# Patient Record
Sex: Female | Born: 1960 | Race: Black or African American | Hispanic: No | Marital: Single | Smoking: Never smoker
Health system: Southern US, Community
[De-identification: ages and names within clinical notes are randomized; demographics above are authoritative.]

## PROBLEM LIST (undated history)

## (undated) DIAGNOSIS — C50919 Malignant neoplasm of unspecified site of unspecified female breast: Secondary | ICD-10-CM

## (undated) HISTORY — PX: BIOPSY BREAST: PRO8

## (undated) HISTORY — PX: OTHER SURGICAL HISTORY: SHX169

## (undated) HISTORY — DX: Malignant neoplasm of unspecified site of unspecified female breast: C50.919

## (undated) HISTORY — PX: BREAST LUMPECTOMY: SHX2

---

## 1989-05-06 HISTORY — PX: TUBAL LIGATION: SHX77

## 1999-10-02 ENCOUNTER — Ambulatory Visit (HOSPITAL_COMMUNITY): Admission: RE | Admit: 1999-10-02 | Discharge: 1999-10-02 | Payer: Self-pay | Admitting: Family Medicine

## 1999-10-02 ENCOUNTER — Encounter: Payer: Self-pay | Admitting: Family Medicine

## 2003-04-06 ENCOUNTER — Ambulatory Visit (HOSPITAL_COMMUNITY): Admission: RE | Admit: 2003-04-06 | Discharge: 2003-04-06 | Payer: Self-pay | Admitting: Family Medicine

## 2003-12-14 ENCOUNTER — Ambulatory Visit (HOSPITAL_COMMUNITY): Admission: RE | Admit: 2003-12-14 | Discharge: 2003-12-14 | Payer: Self-pay | Admitting: Emergency Medicine

## 2003-12-22 ENCOUNTER — Ambulatory Visit (HOSPITAL_COMMUNITY): Admission: RE | Admit: 2003-12-22 | Discharge: 2003-12-22 | Payer: Self-pay | Admitting: Family Medicine

## 2005-06-13 ENCOUNTER — Encounter: Admission: RE | Admit: 2005-06-13 | Discharge: 2005-06-13 | Payer: Self-pay | Admitting: Family Medicine

## 2005-07-03 ENCOUNTER — Encounter: Admission: RE | Admit: 2005-07-03 | Discharge: 2005-07-03 | Payer: Self-pay | Admitting: Family Medicine

## 2006-07-09 ENCOUNTER — Encounter: Admission: RE | Admit: 2006-07-09 | Discharge: 2006-07-09 | Payer: Self-pay | Admitting: Family Medicine

## 2006-07-23 ENCOUNTER — Encounter: Admission: RE | Admit: 2006-07-23 | Discharge: 2006-07-23 | Payer: Self-pay | Admitting: Family Medicine

## 2006-10-14 IMAGING — CT CT CHEST W/O CM
2 of 3 series · 15 of 35 positions shown, 19 images · IV contrast (agent unspecified)
Comparison: 12/22/03.

CLINICAL DATA: Follow up 1 cm benign breast biopsy.
 CHEST CT WITHOUT CONTRAST:
TECHNIQUE: Multidetector CT imaging of the chest was performed following the standard protocol without IV contrast.

[Series 2: chest w/o · axial · non-contrast · 0.66mm/px · z∈[-346,-41]mm · 13 of 73 slices shown, 17 images]
[im 6/73  mediastinal]
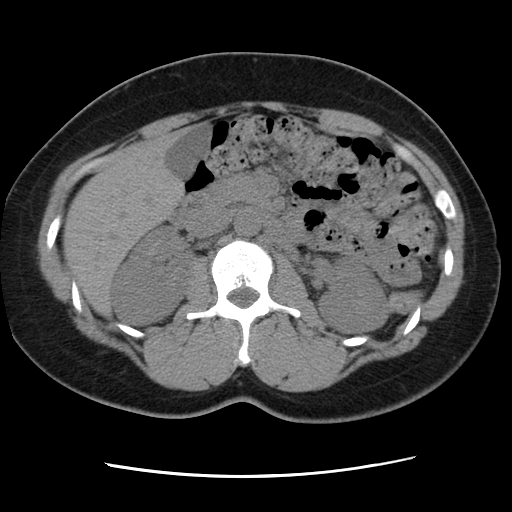
[im 6/73  lung]
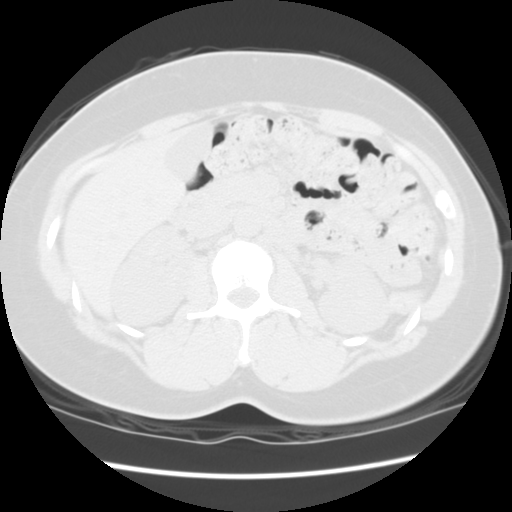
[im 11/73  lung]
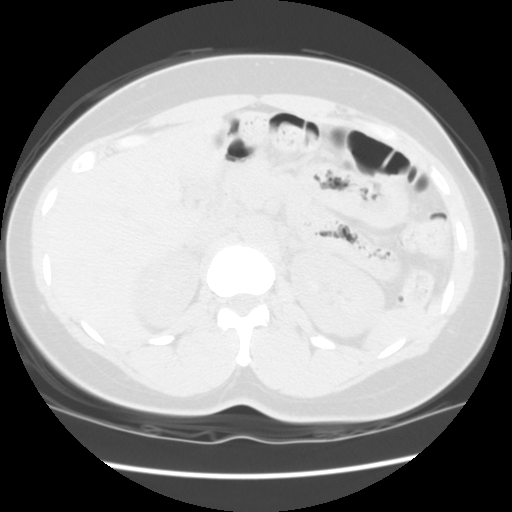
[im 17/73  lung]
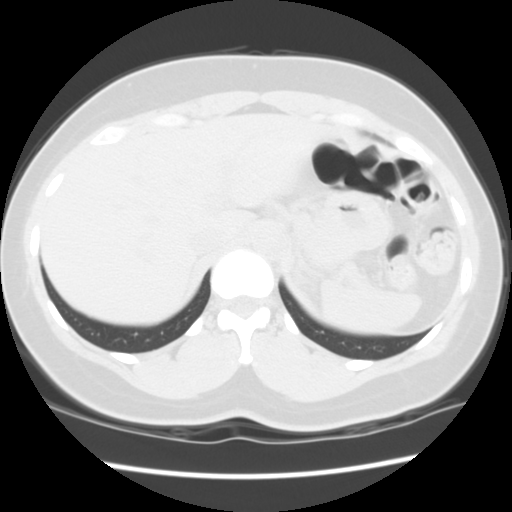
[im 22/73  lung]
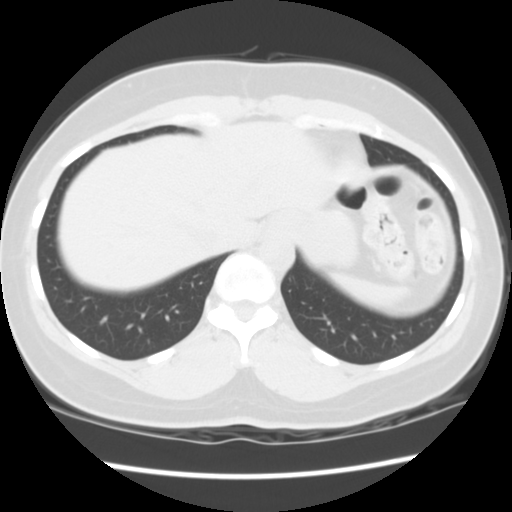
[im 27/73  mediastinal]
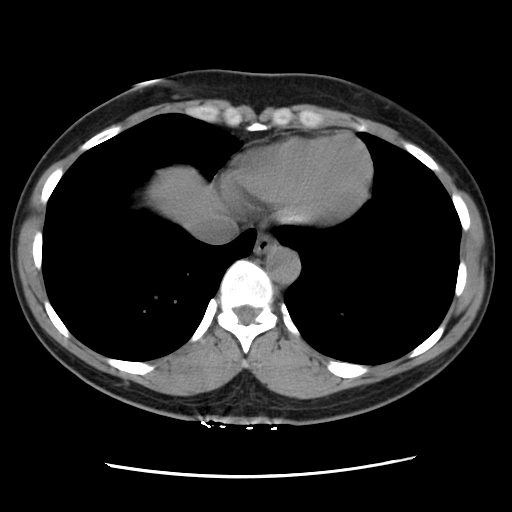
[im 27/73  lung]
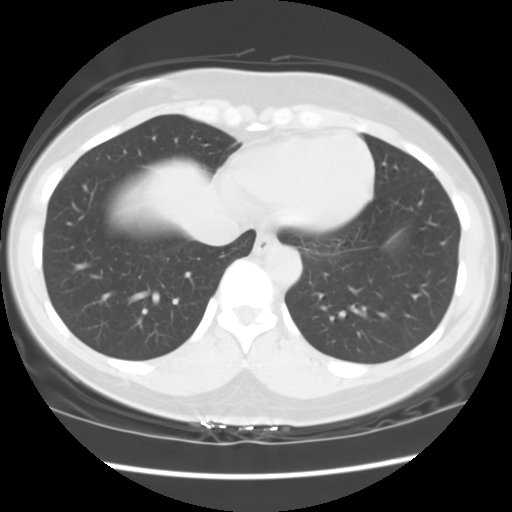
[im 33/73  lung]
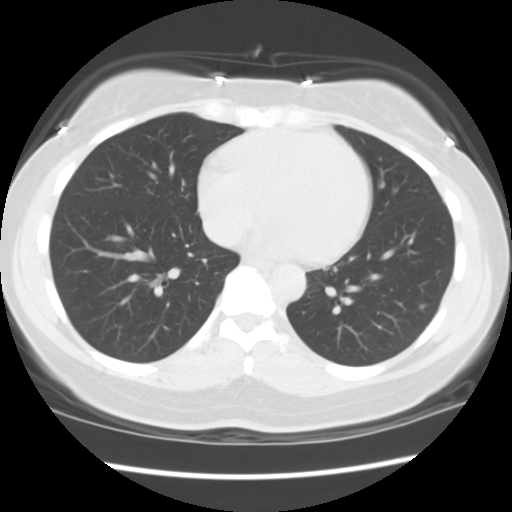
[im 36/73  lung]
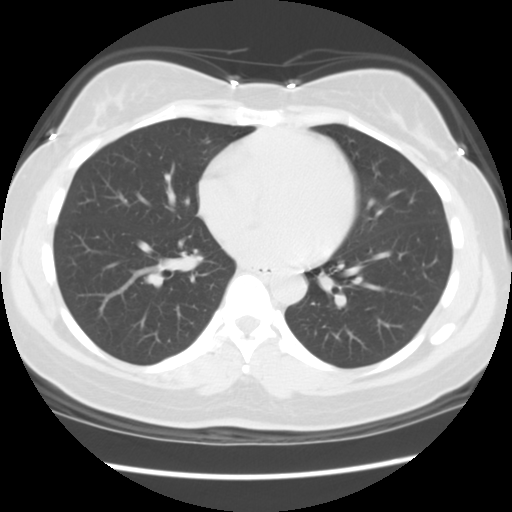
[im 41/73  lung]
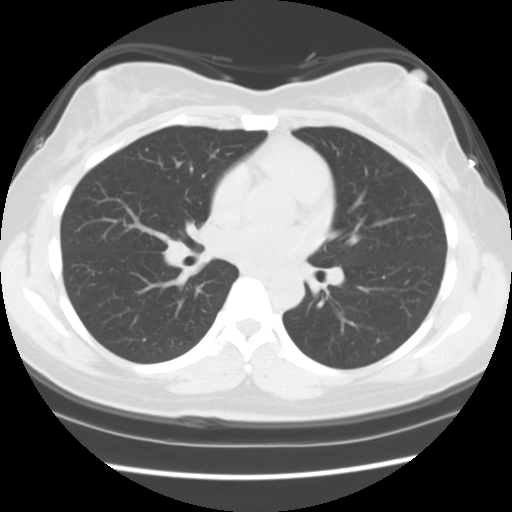
[im 46/73  mediastinal]
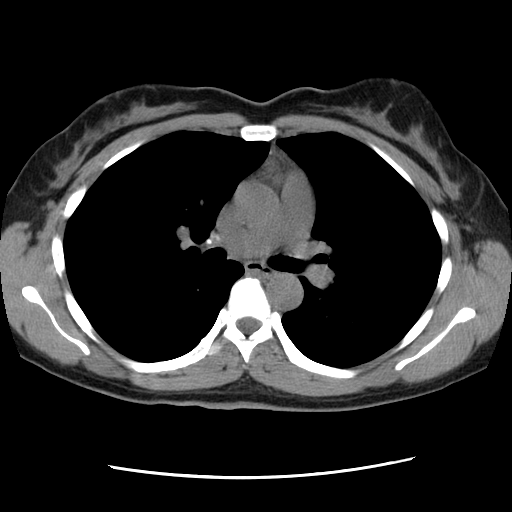
[im 46/73  lung]
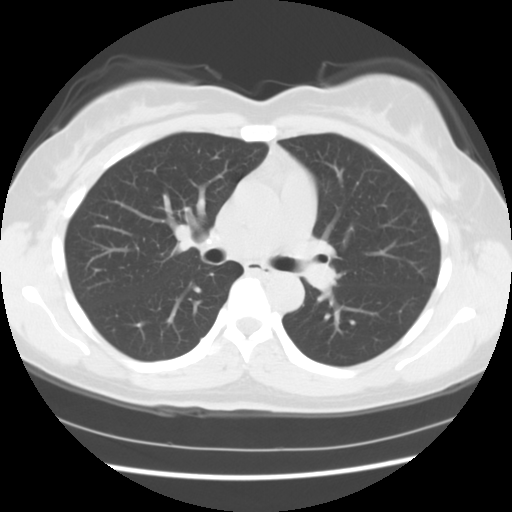
[im 51/73  lung]
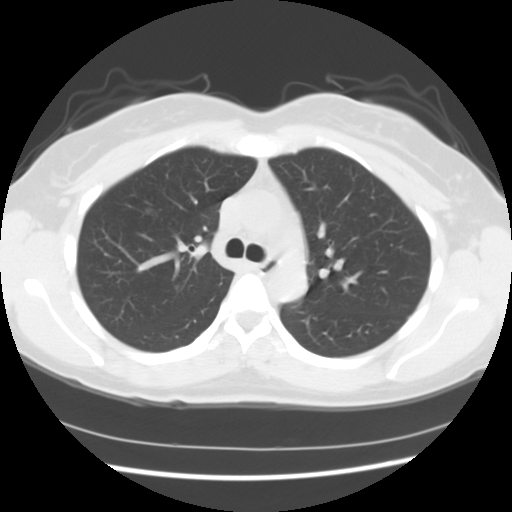
[im 57/73  lung]
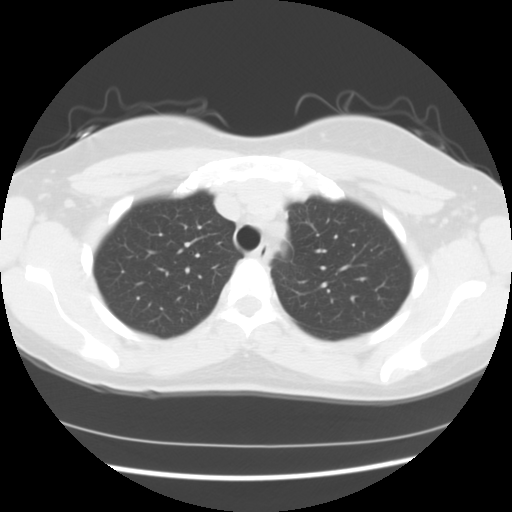
[im 62/73  lung]
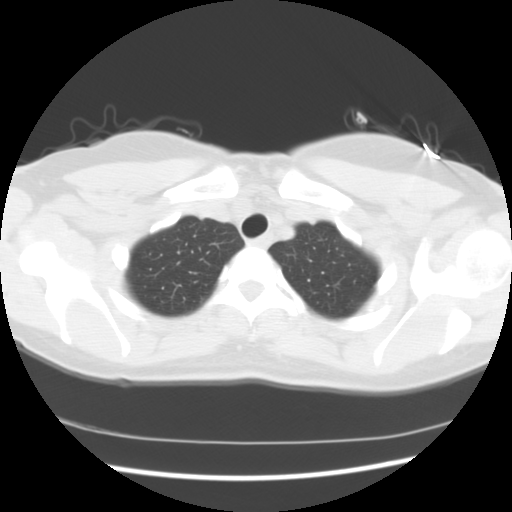
[im 67/73  mediastinal]
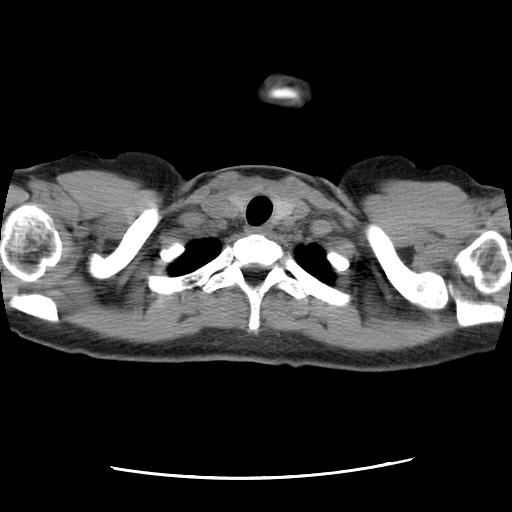
[im 67/73  lung]
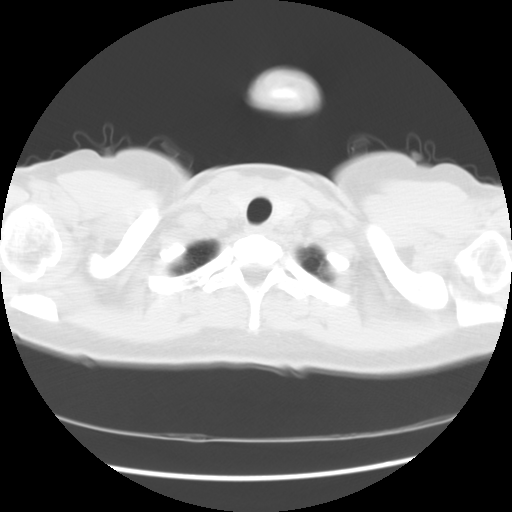

[Series 217: reformatted · sagittal · 0.71mm/px · 2 of 114 slices shown]
[im 38/114  lung]
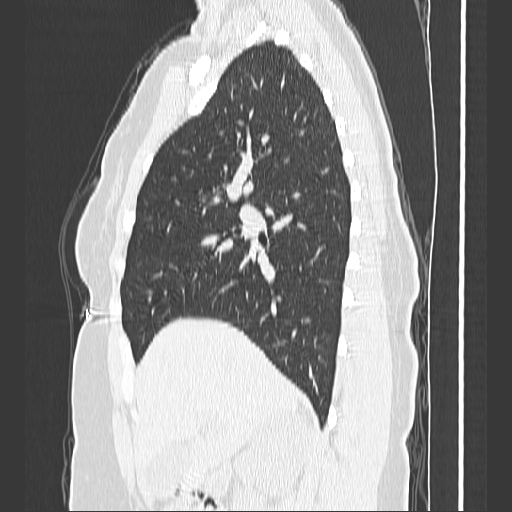
[im 76/114  lung]
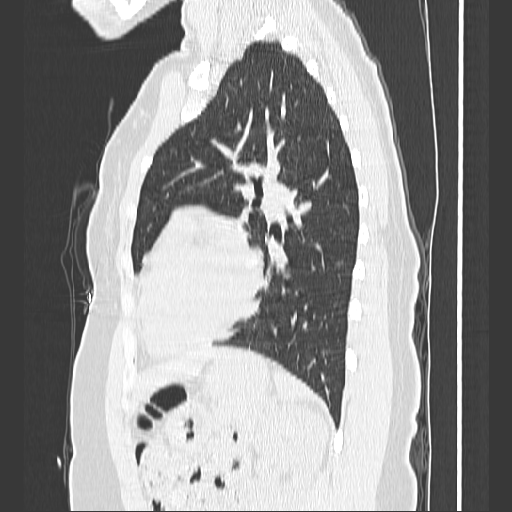

[15 of 35 positions shown; findings below may reference images not displayed]

FINDINGS: No pathologically enlarged lymph nodes are identified within the axilla.  
 Calcified right paratracheal, right hilar lymph nodes are again noted consistent with prior granulomatous inflammation.  There is no pericardial effusion.  No pleural effusion.  Calcified granuloma at the right upper lobe (image 21) stable when compared to the prior exam.  
 Previously described right upper lobe nodule is again seen measuring 9.3 x 6.3 mm (image 24).  Ground glass nodule in the left lower lobe (image 40) is also unchanged from the prior exam.  No new suspicious pulmonary nodules or masses are identified.  Review of the bone windows is unremarkable.
IMPRESSION: 1.  Stable 1 cm nodule within right upper lobe.  A follow up scan in 12 months is recommended.

## 2007-07-14 ENCOUNTER — Encounter: Admission: RE | Admit: 2007-07-14 | Discharge: 2007-07-14 | Payer: Self-pay | Admitting: Family Medicine

## 2007-08-19 ENCOUNTER — Other Ambulatory Visit: Admission: RE | Admit: 2007-08-19 | Discharge: 2007-08-19 | Payer: Self-pay | Admitting: Family Medicine

## 2009-05-06 DIAGNOSIS — C50919 Malignant neoplasm of unspecified site of unspecified female breast: Secondary | ICD-10-CM

## 2009-05-06 HISTORY — DX: Malignant neoplasm of unspecified site of unspecified female breast: C50.919

## 2010-05-06 HISTORY — PX: PORTACATH PLACEMENT: SHX2246

## 2010-05-27 ENCOUNTER — Encounter: Payer: Self-pay | Admitting: Family Medicine

## 2012-06-12 ENCOUNTER — Other Ambulatory Visit: Payer: Self-pay | Admitting: Physician Assistant

## 2012-06-12 DIAGNOSIS — R87810 Cervical high risk human papillomavirus (HPV) DNA test positive: Secondary | ICD-10-CM | POA: Insufficient documentation

## 2012-06-12 DIAGNOSIS — Z01419 Encounter for gynecological examination (general) (routine) without abnormal findings: Secondary | ICD-10-CM | POA: Insufficient documentation

## 2012-06-12 DIAGNOSIS — Z1151 Encounter for screening for human papillomavirus (HPV): Secondary | ICD-10-CM | POA: Insufficient documentation

## 2012-06-17 ENCOUNTER — Other Ambulatory Visit (HOSPITAL_COMMUNITY)
Admission: RE | Admit: 2012-06-17 | Discharge: 2012-06-17 | Disposition: A | Discharge status: Home / Self Care | Payer: Commercial Indemnity | Source: Ambulatory Visit | Attending: Family Medicine | Admitting: Family Medicine

## 2012-06-25 ENCOUNTER — Telehealth: Payer: Self-pay | Admitting: *Deleted

## 2012-06-25 NOTE — Telephone Encounter (Signed)
Confirmed 07/09/12 appt w/ pt.  Mailed letter & packet to pt. Took paperwork to med rec for chart.

## 2012-07-03 ENCOUNTER — Other Ambulatory Visit: Payer: Self-pay | Admitting: *Deleted

## 2012-07-03 DIAGNOSIS — C50919 Malignant neoplasm of unspecified site of unspecified female breast: Secondary | ICD-10-CM

## 2012-07-09 ENCOUNTER — Telehealth: Payer: Self-pay | Admitting: Oncology

## 2012-07-09 ENCOUNTER — Ambulatory Visit: Payer: Commercial Indemnity

## 2012-07-09 ENCOUNTER — Ambulatory Visit (HOSPITAL_BASED_OUTPATIENT_CLINIC_OR_DEPARTMENT_OTHER): Payer: Commercial Indemnity | Admitting: Oncology

## 2012-07-09 ENCOUNTER — Encounter: Payer: Self-pay | Admitting: Oncology

## 2012-07-09 ENCOUNTER — Other Ambulatory Visit (HOSPITAL_BASED_OUTPATIENT_CLINIC_OR_DEPARTMENT_OTHER): Payer: Commercial Indemnity

## 2012-07-09 VITALS — BP 143/83 | HR 68 | Temp 98.2°F | Resp 20 | Ht 68.5 in | Wt 192.2 lb

## 2012-07-09 DIAGNOSIS — C50911 Malignant neoplasm of unspecified site of right female breast: Secondary | ICD-10-CM

## 2012-07-09 DIAGNOSIS — Z853 Personal history of malignant neoplasm of breast: Secondary | ICD-10-CM

## 2012-07-09 LAB — COMPREHENSIVE METABOLIC PANEL (CC13)
AST: 23 U/L (ref 5–34)
Albumin: 3.7 g/dL (ref 3.5–5.0)
BUN: 13.1 mg/dL (ref 7.0–26.0)
CO2: 27 mEq/L (ref 22–29)
Calcium: 9.5 mg/dL (ref 8.4–10.4)
Creatinine: 0.8 mg/dL (ref 0.6–1.1)
Sodium: 139 mEq/L (ref 136–145)
Total Protein: 7.6 g/dL (ref 6.4–8.3)

## 2012-07-09 LAB — CBC WITH DIFFERENTIAL/PLATELET
EOS%: 4.7 % (ref 0.0–7.0)
Eosinophils Absolute: 0.2 10*3/uL (ref 0.0–0.5)
HCT: 38.3 % (ref 34.8–46.6)
HGB: 12.9 g/dL (ref 11.6–15.9)
MCH: 30.1 pg (ref 25.1–34.0)
MCHC: 33.7 g/dL (ref 31.5–36.0)
MONO#: 0.3 10*3/uL (ref 0.1–0.9)
RDW: 13.8 % (ref 11.2–14.5)
lymph#: 1.7 10*3/uL (ref 0.9–3.3)

## 2012-07-09 NOTE — Patient Instructions (Addendum)
We discussed your pathology and treatments you have received in [place]  We discussed tamoxifen  Tamoxifen oral tablet What is this medicine? TAMOXIFEN (ta MOX i fen) blocks the effects of estrogen. It is commonly used to treat breast cancer. It is also used to decrease the chance of breast cancer coming back in women who have received treatment for the disease. It may also help prevent breast cancer in women who have a high risk of developing breast cancer. This medicine may be used for other purposes; ask your health care provider or pharmacist if you have questions. What should I tell my health care provider before I take this medicine? They need to know if you have any of these conditions: -blood clots -blood disease -cataracts or impaired eyesight -endometriosis -high calcium levels -high cholesterol -irregular menstrual cycles -liver disease -stroke -uterine fibroids -an unusual or allergic reaction to tamoxifen, other medicines, foods, dyes, or preservatives -pregnant or trying to get pregnant -breast-feeding How should I use this medicine? Take this medicine by mouth with a glass of water. Follow the directions on the prescription label. You can take it with or without food. Take your medicine at regular intervals. Do not take your medicine more often than directed. Do not stop taking except on your doctor's advice. A special MedGuide will be given to you by the pharmacist with each prescription and refill. Be sure to read this information carefully each time. Talk to your pediatrician regarding the use of this medicine in children. While this drug may be prescribed for selected conditions, precautions do apply. Overdosage: If you think you have taken too much of this medicine contact a poison control center or emergency room at once. NOTE: This medicine is only for you. Do not share this medicine with others. What if I miss a dose? If you miss a dose, take it as soon as  you can. If it is almost time for your next dose, take only that dose. Do not take double or extra doses. What may interact with this medicine? -aminoglutethimide -bromocriptine -chemotherapy drugs -female hormones, like estrogens and birth control pills -letrozole -medroxyprogesterone -phenobarbital -rifampin -warfarin This list may not describe all possible interactions. Give your health care provider a list of all the medicines, herbs, non-prescription drugs, or dietary supplements you use. Also tell them if you smoke, drink alcohol, or use illegal drugs. Some items may interact with your medicine. What should I watch for while using this medicine? Visit your doctor or health care professional for regular checks on your progress. You will need regular pelvic exams, breast exams, and mammograms. If you are taking this medicine to reduce your risk of getting breast cancer, you should know that this medicine does not prevent all types of breast cancer. If breast cancer or other problems occur, there is no guarantee that it will be found at an early stage. Do not become pregnant while taking this medicine or for 2 months after stopping this medicine. Stop taking this medicine if you get pregnant or think you are pregnant and contact your doctor. This medicine may harm your unborn baby. Women who can possibly become pregnant should use birth control methods that do not use hormones during tamoxifen treatment and for 2 months after therapy has stopped. Talk with your health care provider for birth control advice. Do not breast feed while taking this medicine. What side effects may I notice from receiving this medicine? Side effects that you should report to your doctor or  health care professional as soon as possible: -changes in vision (blurred vision) -changes in your menstrual cycle -difficulty breathing or shortness of breath -difficulty walking or talking -new breast lumps -numbness -pelvic  pain or pressure -redness, blistering, peeling or loosening of the skin, including inside the mouth -skin rash or itching (hives) -sudden chest pain -swelling of lips, face, or tongue -swelling, pain or tenderness in your calf or leg -unusual bruising or bleeding -vaginal discharge that is bloody, brown, or rust -weakness -yellowing of the whites of the eyes or skin Side effects that usually do not require medical attention (report to your doctor or health care professional if they continue or are bothersome): -fatigue -hair loss, although uncommon and is usually mild -headache -hot flashes -impotence (in men) -nausea, vomiting (mild) -vaginal discharge (white or clear) This list may not describe all possible side effects. Call your doctor for medical advice about side effects. You may report side effects to FDA at 1-800-FDA-1088. Where should I keep my medicine? Keep out of the reach of children. Store at room temperature between 20 and 25 degrees C (68 and 77 degrees F). Protect from light. Keep container tightly closed. Throw away any unused medicine after the expiration date. NOTE: This sheet is a summary. It may not cover all possible information. If you have questions about this medicine, talk to your doctor, pharmacist, or health care provider.  2012, [employer]vier/Gold Standard. (01/07/2008 12:01:56 PM)

## 2012-07-09 NOTE — Telephone Encounter (Signed)
, °

## 2012-07-15 ENCOUNTER — Other Ambulatory Visit: Payer: Self-pay | Admitting: Oncology

## 2012-07-15 DIAGNOSIS — Z803 Family history of malignant neoplasm of breast: Secondary | ICD-10-CM

## 2012-07-20 NOTE — Progress Notes (Signed)
Audrey Friedman [id number] 13-Oct-1960 51 y.o. 07/20/2012 11:15 PM  CC Dr. Carlena Sax Dr. Holley Bouche REASON FOR CONSULTATION:  52 year old female with history of stage I a breast cancer (T1 B. N0 MX) diagnosed October 2008 11.  STAGE:   T1 BN 0 (stage IA) Right breast 6.0 mm ER +2% PR +4% HER-2/neu positive  REFERRING PHYSICIAN: Dr. Carlena Sax  HISTORY OF PRESENT ILLNESS:  Audrey Friedman is a 52 y.o. female.  Without significant past medical history who at the age of 57 had a mammogram performed that showed grouped/clustered calcifications at the 12:00 anterior position of the right breast. Ultrasound was negative. Patient had a stereotactic biopsy of the right breast in late August 2000 leptin. The biopsy showed a high-grade infiltrating ductal carcinoma arising in the background of high-grade intraductal Arseneau MOPP comedo type. She subsequently had a lumpectomy and sentinel lymph node biopsy on October 9. The tumor was found to be 6 mm node-negative ER 2% HER-2/neu positive PR 4%. Patient was recommended adjuvant chemotherapy consisting of carboplatinum and Taxol and Herceptin completed April 2012. She subsequently received radiation therapy adjuvantly to the right breast. She had maintenance Herceptin through February 2013. Thereafter she was on observation only. She is now relocating to [facility].   Past Medical History: Past Medical History  Diagnosis Date  . Breast cancer 2011    right breast    Past Surgical History: Past Surgical History  Procedure Laterality Date  . Mastectomy partial / lumpectomy Right 2011    Family History: Family History  Problem Relation Age of Onset  . Cancer Mother     Social History History  Substance Use Topics  . Smoking status: Never Smoker   . Smokeless tobacco: Never Used  . Alcohol Use: No    Allergies: Not on File  Current Medications: Current Outpatient Prescriptions  Medication  Sig Dispense Refill  . Cholecalciferol (VITAMIN D PO) Take by mouth once a week.       No current facility-administered medications for this visit.    OB/GYN History:menarche at age 17 patient is premenopausal she's had to live births first live birth at 46.  Fertility Discussion:not applicable Prior History of Cancer:not applicable  Health Maintenance:  Colonoscopyno Bone Densityno Last PAP smear2013  ECOG PERFORMANCE STATUS: 0 - Asymptomatic  Genetic Counseling/testing:we did discuss genetic counseling and testing apparently she has not had this done. She will think about it and let me know  REVIEW OF SYSTEMS:  A comprehensive review of systems was negative.  PHYSICAL EXAMINATION: Blood pressure 143/83, pulse 68, temperature 98.2 F (36.8 C), temperature source Oral, resp. rate 20, height 5' 8.5" (1.74 m), weight 192 lb 3.2 oz (87.181 kg).  WUJ:WJXBJ, healthy, no distress, well nourished and well developed SKIN: skin color, texture, turgor are normal HEAD: Normocephalic EYES: PERRLA, EOMI EARS: External ears normal OROPHARYNX:no exudate and no erythema  NECK: supple, no adenopathy LYMPH:  no palpable lymphadenopathy, no hepatosplenomegaly BREAST:left breast normal without mass, skin or nipple changes or axillary nodes, surgical scars noted in the right breast without any evidence of local recurrence no nipple discharge. LUNGS: clear to auscultation and percussion HEART: regular rate & rhythm ABDOMEN:abdomen soft, non-tender and no masses or organomegaly BACK: No CVA tenderness EXTREMITIES:no edema, no clubbing, no cyanosis  NEURO: alert & oriented x 3 with fluent speech, no focal motor/sensory deficits, gait normal, reflexes normal and symmetric     STUDIES/RESULTS: No results found.   LABS:    Chemistry  Component Value Date/Time   NA 139 07/09/2012 1457   K 3.8 07/09/2012 1457   CL 105 07/09/2012 1457   CO2 27 07/09/2012 1457   BUN 13.1 07/09/2012 1457    CREATININE 0.8 07/09/2012 1457      Component Value Date/Time   CALCIUM 9.5 07/09/2012 1457   ALKPHOS 73 07/09/2012 1457   AST 23 07/09/2012 1457   ALT 17 07/09/2012 1457   BILITOT 0.36 07/09/2012 1457      Lab Results  Component Value Date   WBC 4.2 07/09/2012   HGB 12.9 07/09/2012   HCT 38.3 07/09/2012   MCV 89.5 07/09/2012   PLT 203 07/09/2012    ASSESSMENT    52 year old female with  #1 history of invasive ductal carcinoma of the right breast diagnosed in 2011. She underwent a right lumpectomy with sentinel lymph node biopsy. Her final pathology revealed a 6 mm node-negative breast cancer with associated DCIS tumor was ER +2% PR +4% and HER-2/neu positive. Patient went on to receive adjuvant chemotherapy consisting of Taxol carboplatinum and Herceptin with maintenance Herceptin completed in February 2013. She also is received radiation therapy to the breast. Overall she tolerated all of her therapy well. She has no evidence of recurrent disease    PLAN:    #1 patient and I discussed her overall survivorship. We also discussed today the possibility of having her on tamoxifen 20 mg daily since her tumor was weakly ER and PR positive. I do think that she would benefit with use of an antiestrogen therapy for the ipsilateral as well as the contralateral breast. She is going to think about this.  #2 in the meantime I will plan on seeing her back in 6 months time or sooner if need arises.      Thank you so much for allowing me to participate in the care of Audrey Friedman. I will continue to follow up the patient with you and assist in her care.  All questions were answered. The patient knows to call the clinic with any problems, questions or concerns. We can certainly see the patient much sooner if necessary.  I spent 60 minutes counseling the patient face to face. The total time spent in the appointment was 60 minutes.  Drue Second, MD Medical/Oncology [facility] [phone]  (beeper) [phone] (Office)  07/20/2012, 11:15 PM

## 2012-07-21 ENCOUNTER — Ambulatory Visit (HOSPITAL_COMMUNITY)
Admission: RE | Admit: 2012-07-21 | Discharge: 2012-07-21 | Disposition: A | Discharge status: Home / Self Care | Payer: Commercial Indemnity | Source: Ambulatory Visit | Attending: Oncology | Admitting: Oncology

## 2012-07-21 DIAGNOSIS — Z803 Family history of malignant neoplasm of breast: Secondary | ICD-10-CM

## 2012-07-21 DIAGNOSIS — Z1231 Encounter for screening mammogram for malignant neoplasm of breast: Secondary | ICD-10-CM | POA: Insufficient documentation

## 2012-07-22 ENCOUNTER — Other Ambulatory Visit: Payer: Self-pay | Admitting: Oncology

## 2012-07-22 DIAGNOSIS — Z9889 Other specified postprocedural states: Secondary | ICD-10-CM

## 2012-07-22 DIAGNOSIS — Z853 Personal history of malignant neoplasm of breast: Secondary | ICD-10-CM

## 2012-07-24 ENCOUNTER — Encounter: Payer: Self-pay | Admitting: Oncology

## 2012-07-27 ENCOUNTER — Encounter: Payer: Self-pay | Admitting: Oncology

## 2012-08-14 ENCOUNTER — Ambulatory Visit
Admission: RE | Admit: 2012-08-14 | Discharge: 2012-08-14 | Disposition: A | Discharge status: Home / Self Care | Payer: Commercial Indemnity | Source: Ambulatory Visit | Attending: Oncology | Admitting: Oncology

## 2012-08-14 DIAGNOSIS — Z9889 Other specified postprocedural states: Secondary | ICD-10-CM

## 2012-08-14 DIAGNOSIS — Z853 Personal history of malignant neoplasm of breast: Secondary | ICD-10-CM

## 2012-08-28 ENCOUNTER — Encounter: Payer: Self-pay | Admitting: *Deleted

## 2012-08-28 NOTE — Progress Notes (Signed)
Mailed after appt letter to pt. 

## 2012-09-14 ENCOUNTER — Encounter: Payer: Self-pay | Admitting: Genetic Counselor

## 2012-09-14 ENCOUNTER — Other Ambulatory Visit: Payer: Commercial Indemnity | Admitting: Lab

## 2012-09-14 ENCOUNTER — Ambulatory Visit (HOSPITAL_BASED_OUTPATIENT_CLINIC_OR_DEPARTMENT_OTHER): Payer: Managed Care, Other (non HMO) | Admitting: Genetic Counselor

## 2012-09-14 DIAGNOSIS — IMO0002 Reserved for concepts with insufficient information to code with codable children: Secondary | ICD-10-CM

## 2012-09-14 DIAGNOSIS — C50911 Malignant neoplasm of unspecified site of right female breast: Secondary | ICD-10-CM

## 2012-09-14 DIAGNOSIS — C50919 Malignant neoplasm of unspecified site of unspecified female breast: Secondary | ICD-10-CM

## 2012-09-14 NOTE — Progress Notes (Signed)
Dr.  Drue Second requested a consultation for genetic counseling and risk assessment for Audrey Friedman, a 52 y.o. female, for discussion of her personal history of breast cancer. She presents to clinic today to discuss the possibility of a genetic predisposition to cancer, and to further clarify her risks, as well as her family members' risks for cancer.   HISTORY OF PRESENT ILLNESS: In 2011, at the age of 74, Tommi Crepeau was diagnosed with invasive cancer of the right breast. This was treated with lumpectomy, chemotherapy and radiation.  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    Past Medical History  Diagnosis Date  . Breast cancer 2011    right breast    Past Surgical History  Procedure Laterality Date  . Mastectomy partial / lumpectomy Right 2011    History  Substance Use Topics  . Smoking status: Never Smoker   . Smokeless tobacco: Never Used  . Alcohol Use: No    REPRODUCTIVE HISTORY AND PERSONAL RISK ASSESSMENT FACTORS: Menarche was at age 70.   Premenopausal Uterus Intact: Yes Ovaries Intact: yes G2P2A0 , first live birth at age 37  She has not previously undergone treatment for infertility.   Never used OCPs   She has not used HRT in the past.    FAMILY HISTORY:  We obtained a detailed, 4-generation family history.  Significant diagnoses are listed below: Family History  Problem Relation Age of Onset  . Throat cancer Mother     smoker  . Cancer Maternal Grandmother     unknown cancer  . HIV/AIDS Brother   . Aneurysm Brother   The patient is the only person in her family with breast cancer.  Her mother had throat cancer at an unknown age and was a a smoker.  The patient's grandmother may have had cancer, but the patient is unsure of what kind it was.  Patient's maternal ancestors are of Caucasian and African American descent, and paternal ancestors are of African American descent. There is no reported Ashkenazi Jewish ancestry. There is no known  consanguinity.  GENETIC COUNSELING RISK ASSESSMENT, DISCUSSION, AND SUGGESTED FOLLOW UP: We reviewed the natural history and genetic etiology of sporadic, familial and hereditary cancer syndromes.  About 5-10% of breast cancer is hereditary.  Of this, about 85% is the result of a BRCA1 or BRCA2 mutation.  We reviewed the red flags of hereditary cancer syndromes and the dominant inheritance patterns. Based on the medical criteria for [employer], the patient does not meet their requirements for genetic testing.  She would need to have been 45 or under at diagnosis, or have a close family member with breast or ovarian cancer.  The patient will contact her maternal aunts to determine if there is cancer in other family members and will get back with me if there is to reassess her family.  The patient's personal history of breast cancer is suggestive of the following possible diagnosis: sporadic breast cancer  We discussed that identification of a hereditary cancer syndrome may help her care providers tailor the patients medical management. If a mutation indicating hereditary cancer syndrome is detected in this case, the [employer] recommendations would include increased cancer surveillance and possible prophylactic surgery. If a mutation is detected, the patient will be referred back to the referring provider and to any additional appropriate care providers to discuss the relevant options.   If a mutation is not found in the patient, this will decrease the likelihood of a hereditary cancer syndrome as  the explanation for her breast cancer. Cancer surveillance options would be discussed for the patient according to the appropriate standard National Comprehensive Cancer Network and American Cancer Society guidelines, with consideration of their personal and family history risk factors. In this case, the patient will be referred back to their care providers for discussions of management.    In order to estimate her chance of having a BRCA mutation, we used statistical models (Penn II) and laboratory data that take into account her personal medical history, family history and ancestry.  Because each model is different, there can be a lot of variability in the risks they give.  Therefore, these numbers must be considered a rough range and not a precise risk of having a BRCA mutation.  This model estimates that she has approximately a 8% chance of having a mutation. Based on this assessment of her family and personal history, genetic testing is not recommended, as she does not meet medical criteria for genetic testing based on [employer] policy.  After considering the risks, benefits, and limitations, the patient will call her family and learn more about the family history of cancer.  If there are others in the family identified as having cancer she will call back and we will reassess her risk.   The patient was seen for a total of 60 minutes, greater than 50% of which was spent face-to-face counseling.  This plan is being carried out per Dr. Drue Second recommendations.  This note will also be sent to the referring provider via the electronic medical record. The patient will be supplied with a summary of this genetic counseling discussion as well as educational information on the discussed hereditary cancer syndromes following the conclusion of their visit.   Patient was discussed with Dr. Drue Second.    _______________________________________________________________________ For Office Staff:  Number of people involved in session: 1 Was an Intern/ student involved with case: no }

## 2012-10-12 ENCOUNTER — Telehealth: Payer: Self-pay | Admitting: Oncology

## 2012-10-12 NOTE — Telephone Encounter (Signed)
, °

## 2012-11-26 ENCOUNTER — Encounter (INDEPENDENT_AMBULATORY_CARE_PROVIDER_SITE_OTHER): Payer: Self-pay

## 2012-12-14 ENCOUNTER — Encounter (INDEPENDENT_AMBULATORY_CARE_PROVIDER_SITE_OTHER): Payer: Self-pay | Admitting: General Surgery

## 2012-12-14 ENCOUNTER — Ambulatory Visit (INDEPENDENT_AMBULATORY_CARE_PROVIDER_SITE_OTHER): Payer: Managed Care, Other (non HMO) | Admitting: General Surgery

## 2012-12-14 VITALS — BP 120/70 | HR 68 | Temp 98.9°F | Resp 14 | Ht 69.0 in | Wt 192.2 lb

## 2012-12-14 DIAGNOSIS — C50911 Malignant neoplasm of unspecified site of right female breast: Secondary | ICD-10-CM

## 2012-12-14 DIAGNOSIS — C50919 Malignant neoplasm of unspecified site of unspecified female breast: Secondary | ICD-10-CM

## 2012-12-14 NOTE — Assessment & Plan Note (Signed)
Pt has no clinical evidence of disease.  She is over 1 year out from receiving chemotherapy.  We will plan port a cath removal.   I discussed risks and benefits.

## 2012-12-14 NOTE — Patient Instructions (Signed)
Main risks are bleeding and infection.  No shower for 2 days.  At least plan on taking 1-2 additional days off after surgery.  No lifting or strenuous activity for 1 week.

## 2012-12-14 NOTE — Progress Notes (Signed)
Chief Complaint  Patient presents with  . New Evaluation    PAC removal    HISTORY: Pt is referred by Dr. Khan in consultation for history of right breast cancer.  She had BCT in 2012.  She underwent chemotherapy for triple positive tumor including the 1 year of herceptin.  She is doing well overall.  She had her mammogram this year that was negative.  She has no breast complaints.  She is ready to get her port removed.  She has not noted any new breast masses, skin dimpling, or nipple retraction.    Past Medical History  Diagnosis Date  . Breast cancer 2011    right breast    Past Surgical History  Procedure Laterality Date  . Portacath placement  05/2010  . Tubal ligation  1991  . Biopsy breast  2011 and 1990's    Current Outpatient Prescriptions  Medication Sig Dispense Refill  . Cholecalciferol (VITAMIN D PO) Take by mouth once a week.      . Multiple Vitamins-Minerals (MULTI VITAMIN/MINERALS PO) Take by mouth.       No current facility-administered medications for this visit.     Not on File   Family History  Problem Relation Age of Onset  . Throat cancer Mother     smoker  . Cancer Mother     throat  . Cancer Maternal Grandmother     unknown cancer  . HIV/AIDS Brother   . Aneurysm Brother      History   Social History  . Marital Status: Divorced    Spouse Name: N/A    Number of Children: N/A  . Years of Education: N/A   Social History Main Topics  . Smoking status: Never Smoker   . Smokeless tobacco: Never Used  . Alcohol Use: No  . Drug Use: No  . Sexually Active: Yes    REVIEW OF SYSTEMS - PERTINENT POSITIVES ONLY: 12 point review of systems negative other than HPI and PMH except for sore throat and left arm pain.    EXAM: Filed Vitals:   12/14/12 1332  BP: 120/70  Pulse: 68  Temp: 98.9 F (37.2 C)  Resp: 14   Filed Weights   12/14/12 1332  Weight: 192 lb 3.2 oz (87.181 kg)    Gen:  No acute distress.  Well nourished and well  groomed.   Neurological: Alert and oriented to person, place, and time. Coordination normal.  Head: Normocephalic and atraumatic.  Eyes: Conjunctivae are normal. Pupils are equal, round, and reactive to light. No scleral icterus.  Neck: Normal range of motion. Neck supple. No tracheal deviation or thyromegaly present.  Cardiovascular: Normal rate, regular rhythm,  and intact distal pulses.  Respiratory: Effort normal.  No respiratory distress. No chest wall tenderness. Port a cath in place upper left chest.   Breast:  No masses.  Normal post op changes right breast.  Slightly smaller right breast and expected thickening along incision.  No masses or lymphadenopathy.   GI: Soft. Bowel sounds are normal. The abdomen is soft and nontender.  There is no rebound and no guarding.  Musculoskeletal: Normal range of motion. Extremities are nontender.  Lymphadenopathy: No cervical, preauricular, postauricular or axillary adenopathy is present Skin: Skin is warm and dry. No rash noted. No diaphoresis. No erythema. No pallor. No clubbing, cyanosis, or edema.   Psychiatric: Normal mood and affect. Behavior is normal. Judgment and thought content normal.    LABORATORY RESULTS: Available labs are reviewed    Labs from 6 months ago normal.    RADIOLOGY RESULTS: See E-Chart or I-Site for most recent results.  Images and reports are reviewed. Mammogram IMPRESSION:  Post lumpectomy changes/scarring within the upper outer right  breast without findings suspicious for malignancy.  BI-RADS CATEGORY 2: Benign finding(s).    ASSESSMENT AND PLAN: Breast cancer Pt has no clinical evidence of disease.  She is over 1 year out from receiving chemotherapy.  We will plan port a cath removal.   I discussed risks and benefits.     Janice Seales L Analiyah Lechuga MD Surgical Oncology, General and Endocrine Surgery [facility]      Visit Diagnoses: Breast cancer  Primary Care Physician: Candace  Smith, MD  Oncologist:  Dr. Khan.     

## 2012-12-24 ENCOUNTER — Encounter (HOSPITAL_BASED_OUTPATIENT_CLINIC_OR_DEPARTMENT_OTHER): Payer: Self-pay | Admitting: *Deleted

## 2012-12-24 NOTE — Progress Notes (Signed)
Bring all medications

## 2012-12-31 ENCOUNTER — Encounter (HOSPITAL_BASED_OUTPATIENT_CLINIC_OR_DEPARTMENT_OTHER): Payer: Self-pay | Admitting: *Deleted

## 2012-12-31 ENCOUNTER — Ambulatory Visit (HOSPITAL_BASED_OUTPATIENT_CLINIC_OR_DEPARTMENT_OTHER): Payer: Managed Care, Other (non HMO) | Admitting: Anesthesiology

## 2012-12-31 ENCOUNTER — Ambulatory Visit (HOSPITAL_BASED_OUTPATIENT_CLINIC_OR_DEPARTMENT_OTHER)
Admission: RE | Admit: 2012-12-31 | Discharge: 2012-12-31 | Disposition: A | Discharge status: Home / Self Care | Payer: Managed Care, Other (non HMO) | Source: Ambulatory Visit | Attending: General Surgery | Admitting: General Surgery

## 2012-12-31 ENCOUNTER — Encounter (HOSPITAL_BASED_OUTPATIENT_CLINIC_OR_DEPARTMENT_OTHER)
Admission: RE | Disposition: A | Discharge status: Home / Self Care | Payer: Self-pay | Source: Ambulatory Visit | Attending: General Surgery

## 2012-12-31 ENCOUNTER — Encounter (HOSPITAL_BASED_OUTPATIENT_CLINIC_OR_DEPARTMENT_OTHER): Payer: Self-pay | Admitting: Anesthesiology

## 2012-12-31 DIAGNOSIS — Z452 Encounter for adjustment and management of vascular access device: Secondary | ICD-10-CM

## 2012-12-31 DIAGNOSIS — Z9221 Personal history of antineoplastic chemotherapy: Secondary | ICD-10-CM | POA: Insufficient documentation

## 2012-12-31 DIAGNOSIS — Z853 Personal history of malignant neoplasm of breast: Secondary | ICD-10-CM | POA: Insufficient documentation

## 2012-12-31 HISTORY — PX: PORT-A-CATH REMOVAL: SHX5289

## 2012-12-31 SURGERY — REMOVAL PORT-A-CATH
Anesthesia: General | Laterality: Left | Wound class: Clean

## 2012-12-31 MED ORDER — CHLORHEXIDINE GLUCONATE 4 % EX LIQD: 1.0000 "application " | Freq: Once | CUTANEOUS | Status: DC

## 2012-12-31 MED ORDER — SODIUM CHLORIDE 0.9 % IV SOLN: 250.0000 mL | INTRAVENOUS | Status: DC | PRN

## 2012-12-31 MED ORDER — HYDROCODONE-ACETAMINOPHEN 5-325 MG PO TABS: 1.0000 | ORAL_TABLET | ORAL | Status: DC | PRN

## 2012-12-31 MED ORDER — ONDANSETRON HCL 4 MG/2ML IJ SOLN
INTRAMUSCULAR | Status: DC | PRN
  Administered 2012-12-31: 4 mg via INTRAVENOUS

## 2012-12-31 MED ORDER — SODIUM CHLORIDE 0.9 % IJ SOLN: 3.0000 mL | Freq: Two times a day (BID) | INTRAMUSCULAR | Status: DC

## 2012-12-31 MED ORDER — OXYCODONE HCL 5 MG PO TABS
5.0000 mg | ORAL_TABLET | Freq: Once | ORAL | Status: AC | PRN
  Administered 2012-12-31: 5 mg via ORAL

## 2012-12-31 MED ORDER — ONDANSETRON HCL 4 MG/2ML IJ SOLN: 4.0000 mg | Freq: Four times a day (QID) | INTRAMUSCULAR | Status: DC | PRN

## 2012-12-31 MED ORDER — PROPOFOL 10 MG/ML IV BOLUS
INTRAVENOUS | Status: DC | PRN
  Administered 2012-12-31: 200 mg via INTRAVENOUS

## 2012-12-31 MED ORDER — BUPIVACAINE-EPINEPHRINE 0.5% -1:200000 IJ SOLN
INTRAMUSCULAR | Status: DC | PRN
  Administered 2012-12-31: 6 mL

## 2012-12-31 MED ORDER — OXYCODONE HCL 5 MG PO TABS: 5.0000 mg | ORAL_TABLET | ORAL | Status: DC | PRN

## 2012-12-31 MED ORDER — LACTATED RINGERS IV SOLN
INTRAVENOUS | Status: DC
  Administered 2012-12-31: 08:00:00 via INTRAVENOUS

## 2012-12-31 MED ORDER — LIDOCAINE HCL (CARDIAC) 20 MG/ML IV SOLN
INTRAVENOUS | Status: DC | PRN
  Administered 2012-12-31: 50 mg via INTRAVENOUS

## 2012-12-31 MED ORDER — OXYCODONE HCL 5 MG/5ML PO SOLN: 5.0000 mg | Freq: Once | ORAL | Status: AC | PRN

## 2012-12-31 MED ORDER — CEFAZOLIN SODIUM-DEXTROSE 2-3 GM-% IV SOLR
2.0000 g | INTRAVENOUS | Status: AC
Start: 2012-12-31 — End: 2012-12-31
  Administered 2012-12-31: 2 g via INTRAVENOUS

## 2012-12-31 MED ORDER — FENTANYL CITRATE 0.05 MG/ML IJ SOLN: 50.0000 ug | INTRAMUSCULAR | Status: DC | PRN

## 2012-12-31 MED ORDER — SODIUM CHLORIDE 0.9 % IJ SOLN: 3.0000 mL | INTRAMUSCULAR | Status: DC | PRN

## 2012-12-31 MED ORDER — ACETAMINOPHEN 325 MG PO TABS: 650.0000 mg | ORAL_TABLET | ORAL | Status: DC | PRN

## 2012-12-31 MED ORDER — MIDAZOLAM HCL 5 MG/5ML IJ SOLN
INTRAMUSCULAR | Status: DC | PRN
  Administered 2012-12-31: 1 mg via INTRAVENOUS

## 2012-12-31 MED ORDER — FENTANYL CITRATE 0.05 MG/ML IJ SOLN
INTRAMUSCULAR | Status: DC | PRN
  Administered 2012-12-31: 50 ug via INTRAVENOUS

## 2012-12-31 MED ORDER — MIDAZOLAM HCL 2 MG/2ML IJ SOLN: 1.0000 mg | INTRAMUSCULAR | Status: DC | PRN

## 2012-12-31 MED ORDER — DEXAMETHASONE SODIUM PHOSPHATE 4 MG/ML IJ SOLN
INTRAMUSCULAR | Status: DC | PRN
  Administered 2012-12-31: 8 mg via INTRAVENOUS

## 2012-12-31 MED ORDER — HYDROMORPHONE HCL PF 1 MG/ML IJ SOLN: 0.2500 mg | INTRAMUSCULAR | Status: DC | PRN

## 2012-12-31 MED ORDER — ACETAMINOPHEN 650 MG RE SUPP: 650.0000 mg | RECTAL | Status: DC | PRN

## 2012-12-31 SURGICAL SUPPLY — 39 items
ADH SKN CLS APL DERMABOND .7 (GAUZE/BANDAGES/DRESSINGS) ×1
BLADE HEX COATED 2.75 (ELECTRODE) ×2 IMPLANT
BLADE SURG 15 STRL LF DISP TIS (BLADE) ×1 IMPLANT
BLADE SURG 15 STRL SS (BLADE) ×2
CANISTER SUCTION 1200CC (MISCELLANEOUS) IMPLANT
CHLORAPREP W/TINT 26ML (MISCELLANEOUS) ×2 IMPLANT
CLOTH BEACON ORANGE TIMEOUT ST (SAFETY) ×2 IMPLANT
COVER MAYO STAND STRL (DRAPES) ×2 IMPLANT
COVER TABLE BACK 60X90 (DRAPES) ×2 IMPLANT
DECANTER SPIKE VIAL GLASS SM (MISCELLANEOUS) IMPLANT
DERMABOND ADVANCED (GAUZE/BANDAGES/DRESSINGS) ×1
DERMABOND ADVANCED .7 DNX12 (GAUZE/BANDAGES/DRESSINGS) ×1 IMPLANT
DRAPE PED LAPAROTOMY (DRAPES) ×2 IMPLANT
DRAPE UTILITY XL STRL (DRAPES) ×2 IMPLANT
ELECT REM PT RETURN 9FT ADLT (ELECTROSURGICAL) ×2
ELECTRODE REM PT RTRN 9FT ADLT (ELECTROSURGICAL) ×1 IMPLANT
GLOVE BIO SURGEON STRL SZ 6 (GLOVE) ×2 IMPLANT
GLOVE BIOGEL PI IND STRL 6.5 (GLOVE) ×1 IMPLANT
GLOVE BIOGEL PI IND STRL 7.5 (GLOVE) IMPLANT
GLOVE BIOGEL PI INDICATOR 6.5 (GLOVE) ×1
GLOVE BIOGEL PI INDICATOR 7.5 (GLOVE) ×1
GLOVE SURG SS PI 7.0 STRL IVOR (GLOVE) ×2 IMPLANT
GOWN PREVENTION PLUS XLARGE (GOWN DISPOSABLE) ×2 IMPLANT
GOWN PREVENTION PLUS XXLARGE (GOWN DISPOSABLE) ×3 IMPLANT
NDL HYPO 25X1 1.5 SAFETY (NEEDLE) ×1 IMPLANT
NEEDLE HYPO 25X1 1.5 SAFETY (NEEDLE) ×2 IMPLANT
NS IRRIG 1000ML POUR BTL (IV SOLUTION) IMPLANT
PACK BASIN DAY SURGERY FS (CUSTOM PROCEDURE TRAY) ×2 IMPLANT
PENCIL BUTTON HOLSTER BLD 10FT (ELECTRODE) ×2 IMPLANT
SLEEVE SCD COMPRESS KNEE MED (MISCELLANEOUS) ×1 IMPLANT
SUT MNCRL AB 4-0 PS2 18 (SUTURE) ×2 IMPLANT
SUT VIC AB 3-0 SH 27 (SUTURE) ×2
SUT VIC AB 3-0 SH 27X BRD (SUTURE) ×1 IMPLANT
SYR CONTROL 10ML LL (SYRINGE) ×2 IMPLANT
TOWEL OR 17X24 6PK STRL BLUE (TOWEL DISPOSABLE) ×2 IMPLANT
TOWEL OR NON WOVEN STRL DISP B (DISPOSABLE) ×2 IMPLANT
TUBE CONNECTING 20X1/4 (TUBING) IMPLANT
TUBING SCD EXPRESS 7FT (MISCELLANEOUS) ×1 IMPLANT
YANKAUER SUCT BULB TIP NO VENT (SUCTIONS) IMPLANT

## 2012-12-31 NOTE — H&P (View-Only) (Signed)
Chief Complaint  Patient presents with  . New Evaluation    PAC removal    HISTORY: Pt is referred by Dr. Welton Flakes in consultation for history of right breast cancer.  She had BCT in 2012.  She underwent chemotherapy for triple positive tumor including the 1 year of herceptin.  She is doing well overall.  She had her mammogram this year that was negative.  She has no breast complaints.  She is ready to get her port removed.  She has not noted any new breast masses, skin dimpling, or nipple retraction.    Past Medical History  Diagnosis Date  . Breast cancer 2011    right breast    Past Surgical History  Procedure Laterality Date  . Portacath placement  05/2010  . Tubal ligation  1991  . Biopsy breast  2011 and 1990's    Current Outpatient Prescriptions  Medication Sig Dispense Refill  . Cholecalciferol (VITAMIN D PO) Take by mouth once a week.      . Multiple Vitamins-Minerals (MULTI VITAMIN/MINERALS PO) Take by mouth.       No current facility-administered medications for this visit.     Not on File   Family History  Problem Relation Age of Onset  . Throat cancer Mother     smoker  . Cancer Mother     throat  . Cancer Maternal Grandmother     unknown cancer  . HIV/AIDS Brother   . Aneurysm Brother      History   Social History  . Marital Status: Divorced    Spouse Name: N/A    Number of Children: N/A  . Years of Education: N/A   Social History Main Topics  . Smoking status: Never Smoker   . Smokeless tobacco: Never Used  . Alcohol Use: No  . Drug Use: No  . Sexually Active: Yes    REVIEW OF SYSTEMS - PERTINENT POSITIVES ONLY: 12 point review of systems negative other than HPI and PMH except for sore throat and left arm pain.    EXAM: Filed Vitals:   12/14/12 1332  BP: 120/70  Pulse: 68  Temp: 98.9 F (37.2 C)  Resp: 14   Filed Weights   12/14/12 1332  Weight: 192 lb 3.2 oz (87.181 kg)    Gen:  No acute distress.  Well nourished and well  groomed.   Neurological: Alert and oriented to person, place, and time. Coordination normal.  Head: Normocephalic and atraumatic.  Eyes: Conjunctivae are normal. Pupils are equal, round, and reactive to light. No scleral icterus.  Neck: Normal range of motion. Neck supple. No tracheal deviation or thyromegaly present.  Cardiovascular: Normal rate, regular rhythm,  and intact distal pulses.  Respiratory: Effort normal.  No respiratory distress. No chest wall tenderness. Port a cath in place upper left chest.   Breast:  No masses.  Normal post op changes right breast.  Slightly smaller right breast and expected thickening along incision.  No masses or lymphadenopathy.   GI: Soft. Bowel sounds are normal. The abdomen is soft and nontender.  There is no rebound and no guarding.  Musculoskeletal: Normal range of motion. Extremities are nontender.  Lymphadenopathy: No cervical, preauricular, postauricular or axillary adenopathy is present Skin: Skin is warm and dry. No rash noted. No diaphoresis. No erythema. No pallor. No clubbing, cyanosis, or edema.   Psychiatric: Normal mood and affect. Behavior is normal. Judgment and thought content normal.    LABORATORY RESULTS: Available labs are reviewed  Labs from 6 months ago normal.    RADIOLOGY RESULTS: See E-Chart or I-Site for most recent results.  Images and reports are reviewed. Mammogram IMPRESSION:  Post lumpectomy changes/scarring within the upper outer right  breast without findings suspicious for malignancy.  BI-RADS CATEGORY 2: Benign finding(s).    ASSESSMENT AND PLAN: Breast cancer Pt has no clinical evidence of disease.  She is over 1 year out from receiving chemotherapy.  We will plan port a cath removal.   I discussed risks and benefits.     Maudry Diego MD Surgical Oncology, General and Endocrine Surgery [facility]      Visit Diagnoses: Breast cancer  Primary Care Physician: Merri Brunette, MD  Oncologist:  Dr. Welton Flakes.

## 2012-12-31 NOTE — Anesthesia Preprocedure Evaluation (Signed)
Anesthesia Evaluation  Patient identified by MRN, date of birth, ID band Patient awake    Reviewed: Allergy & Precautions, H&P , NPO status , Patient's Chart, lab work & pertinent test results  Airway Mallampati: I TM Distance: >3 FB Neck ROM: Full    Dental no notable dental hx. (+) Teeth Intact and Dental Advisory Given   Pulmonary neg pulmonary ROS,  breath sounds clear to auscultation  Pulmonary exam normal       Cardiovascular negative cardio ROS  Rhythm:Regular Rate:Normal     Neuro/Psych negative neurological ROS  negative psych ROS   GI/Hepatic negative GI ROS, Neg liver ROS,   Endo/Other  negative endocrine ROS  Renal/GU negative Renal ROS  negative genitourinary   Musculoskeletal   Abdominal   Peds  Hematology negative hematology ROS (+)   Anesthesia Other Findings   Reproductive/Obstetrics negative OB ROS                           Anesthesia Physical Anesthesia Plan  ASA: II  Anesthesia Plan: General   Post-op Pain Management:    Induction: Intravenous  Airway Management Planned: LMA  Additional Equipment:   Intra-op Plan:   Post-operative Plan: Extubation in OR  Informed Consent: I have reviewed the patients History and Physical, chart, labs and discussed the procedure including the risks, benefits and alternatives for the proposed anesthesia with the patient or authorized representative who has indicated his/her understanding and acceptance.   Dental advisory given  Plan Discussed with: CRNA  Anesthesia Plan Comments:         Anesthesia Quick Evaluation

## 2012-12-31 NOTE — Op Note (Addendum)
  PRE-OPERATIVE DIAGNOSIS:  un-needed Port-A-Cath for breast cancer  POST-OPERATIVE DIAGNOSIS:  Same   PROCEDURE:  Procedure(s):  REMOVAL left subclavian PORT-A-CATH  SURGEON:  Surgeon(s):  Almond Lint, MD  ANESTHESIA:   General + local  EBL:   Minimal  SPECIMEN:  None  Complications : none known  Procedure:   Pt was  identified in the holding area and taken to the operating room where she was placed supine on the operating room table.  General anesthesia was induced via LMA.  The Left upper chest was prepped and draped.  The prior incision was anesthetized with local anesthetic.  The incision was opened with a #15 blade.  The subcutaneous tissue was divided with the cautery.  The port was identified and the capsule opened. There were no sutures to remove, as it appeared these were dissolvable.  The port was then removed and pressure held on the tract.  The catheter appeared intact without evidence of breakage.  The wound was inspected for hemostasis, which was achieved with cautery.  The wound was closed with 3-0 vicryl deep dermal interrupted sutures and 4-0 Monocryl running subcuticular suture.  The wound was cleaned, dried, and dressed with dermabond.  The patient was awakened from anesthesia and taken to the PACU in stable condition.  Needle, sponge, and instrument counts are correct.

## 2012-12-31 NOTE — Transfer of Care (Signed)
Immediate Anesthesia Transfer of Care Note  Patient: Audrey Friedman  Procedure(s) Performed: Procedure(s): REMOVAL PORT-A-CATH (Left)  Patient Location: PACU  Anesthesia Type:General  Level of Consciousness: sedated  Airway & Oxygen Therapy: Patient Spontanous Breathing and Patient connected to face mask oxygen  Post-op Assessment: Report given to PACU RN and Post -op Vital signs reviewed and stable  Post vital signs: Reviewed and stable  Complications: No apparent anesthesia complications

## 2012-12-31 NOTE — Anesthesia Procedure Notes (Signed)
Procedure Name: LMA Insertion Performed by: Ephrata Verville, [place] Pre-anesthesia Checklist: Patient identified, Emergency Drugs available, Suction available and Patient being monitored Patient Re-evaluated:Patient Re-evaluated prior to inductionOxygen Delivery Method: Circle System Utilized Preoxygenation: Pre-oxygenation with 100% oxygen Intubation Type: IV induction Ventilation: Mask ventilation without difficulty LMA: LMA inserted LMA Size: 4.0 Number of attempts: 1 Airway Equipment and Method: bite block Placement Confirmation: positive ETCO2 Tube secured with: Tape Dental Injury: Teeth and Oropharynx as per pre-operative assessment      

## 2012-12-31 NOTE — Anesthesia Postprocedure Evaluation (Signed)
  Anesthesia Post-op Note  Patient: Audrey Friedman  Procedure(s) Performed: Procedure(s): REMOVAL PORT-A-CATH (Left)  Patient Location: PACU  Anesthesia Type:General  Level of Consciousness: awake, alert  and oriented  Airway and Oxygen Therapy: Patient Spontanous Breathing  Post-op Pain: mild  Post-op Assessment: Post-op Vital signs reviewed, Patient's Cardiovascular Status Stable, Respiratory Function Stable, Patent Airway and No signs of Nausea or vomiting  Post-op Vital Signs: Reviewed and stable  Complications: No apparent anesthesia complications

## 2012-12-31 NOTE — Interval H&P Note (Signed)
History and Physical Interval Note:  12/31/2012 8:09 AM  Audrey Friedman  has presented today for surgery, with the diagnosis of breast cancer  The various methods of treatment have been discussed with the patient and family. After consideration of risks, benefits and other options for treatment, the patient has consented to  Procedure(s): REMOVAL PORT-A-CATH (N/A) as a surgical intervention .  The patient's history has been reviewed, patient examined, no change in status, stable for surgery.  I have reviewed the patient's chart and labs.  Questions were answered to the patient's satisfaction.     Leigh Blas

## 2013-01-01 ENCOUNTER — Encounter (HOSPITAL_BASED_OUTPATIENT_CLINIC_OR_DEPARTMENT_OTHER): Payer: Self-pay | Admitting: General Surgery

## 2013-01-01 ENCOUNTER — Telehealth (INDEPENDENT_AMBULATORY_CARE_PROVIDER_SITE_OTHER): Payer: Self-pay

## 2013-01-01 NOTE — Telephone Encounter (Signed)
LMOV.  Pt does not need to come in for a post op appointment unless she has questions or concerns.  We need to know what date she is asking to return to work so the letter can be sent to her employer.

## 2013-01-05 ENCOUNTER — Encounter (INDEPENDENT_AMBULATORY_CARE_PROVIDER_SITE_OTHER): Payer: Self-pay

## 2013-01-05 NOTE — Telephone Encounter (Signed)
The pt called back and I let her know that Cyndra Numbers has done her work note and it will be sent to her.  She doesn't need to see Dr Donell Beers before she goes to work but she can see her in 4 weeks.  I scheduled her to come in 9/29 at 12 pm.

## 2013-01-08 ENCOUNTER — Encounter (INDEPENDENT_AMBULATORY_CARE_PROVIDER_SITE_OTHER): Payer: Self-pay

## 2013-01-08 ENCOUNTER — Telehealth (INDEPENDENT_AMBULATORY_CARE_PROVIDER_SITE_OTHER): Payer: Self-pay

## 2013-01-08 NOTE — Telephone Encounter (Signed)
Revised RTW note faxed to patient's employer today.

## 2013-01-13 ENCOUNTER — Ambulatory Visit: Payer: Commercial Indemnity | Admitting: Oncology

## 2013-01-13 ENCOUNTER — Other Ambulatory Visit: Payer: Commercial Indemnity | Admitting: Lab

## 2013-01-18 ENCOUNTER — Other Ambulatory Visit (HOSPITAL_BASED_OUTPATIENT_CLINIC_OR_DEPARTMENT_OTHER): Payer: Managed Care, Other (non HMO) | Admitting: Lab

## 2013-01-18 ENCOUNTER — Encounter: Payer: Self-pay | Admitting: Oncology

## 2013-01-18 ENCOUNTER — Ambulatory Visit (HOSPITAL_BASED_OUTPATIENT_CLINIC_OR_DEPARTMENT_OTHER): Payer: Managed Care, Other (non HMO) | Admitting: Oncology

## 2013-01-18 VITALS — BP 128/82 | HR 99 | Temp 98.0°F | Resp 20 | Ht 69.0 in | Wt 191.9 lb

## 2013-01-18 DIAGNOSIS — C50911 Malignant neoplasm of unspecified site of right female breast: Secondary | ICD-10-CM

## 2013-01-18 DIAGNOSIS — C50919 Malignant neoplasm of unspecified site of unspecified female breast: Secondary | ICD-10-CM

## 2013-01-18 LAB — CBC WITH DIFFERENTIAL/PLATELET
Basophils Absolute: 0 10*3/uL (ref 0.0–0.1)
EOS%: 3.6 % (ref 0.0–7.0)
Eosinophils Absolute: 0.2 10*3/uL (ref 0.0–0.5)
HCT: 38.4 % (ref 34.8–46.6)
HGB: 12.7 g/dL (ref 11.6–15.9)
LYMPH%: 34.3 % (ref 14.0–49.7)
MCH: 29.4 pg (ref 25.1–34.0)
MCV: 88.7 fL (ref 79.5–101.0)
MONO%: 7.2 % (ref 0.0–14.0)
NEUT#: 2.5 10*3/uL (ref 1.5–6.5)
NEUT%: 54.3 % (ref 38.4–76.8)
Platelets: 265 10*3/uL (ref 145–400)
RDW: 14.3 % (ref 11.2–14.5)

## 2013-01-18 LAB — COMPREHENSIVE METABOLIC PANEL (CC13)
AST: 24 U/L (ref 5–34)
Albumin: 3.7 g/dL (ref 3.5–5.0)
Alkaline Phosphatase: 76 U/L (ref 40–150)
BUN: 11.9 mg/dL (ref 7.0–26.0)
Creatinine: 0.9 mg/dL (ref 0.6–1.1)
Glucose: 90 mg/dl (ref 70–140)
Potassium: 4.1 mEq/L (ref 3.5–5.1)

## 2013-01-18 NOTE — Patient Instructions (Addendum)
Return in 6 months  Breast Cancer Survivor Follow-Up Breast cancer begins when cells in the breast divide too rapidly. The extra cells form a lump (tumor). When the cancer is treated, the goal is to get rid of all cancer cells. However, sometimes a few cells survive. These cancer cells can then grow. They become recurrent cancer. This means the cancer comes back after treatment.  Most cases of recurrent breast cancer develop 3 to 5 years after treatment. However, sometimes it comes back just a few months after treatment. Other times, it does not come back until years later. If the cancer comes back in the same area as the first breast cancer, it is called a local recurrence. If the cancer comes back somewhere else in the body, it is called regional recurrence if the site is fairly near the breast or distant recurrence if it is far from the breast. Your caregiver may also use the term metastasize to indicate a cancer that has gone to another part of your body. Treatment is still possible after either kind of recurrence. The cancer can still be controlled.  CAUSES OF RECURRENT CANCER No one knows exactly why breast cancer starts in the first place. Why the cancer comes back after treatment is also not clear. It is known that certain conditions, called risk factors, can make this more likely. They include:  Developing breast cancer for the first time before age 85.  Having breast cancer that involves the lymph nodes. These are small, round pieces of tissue found all over the body. Their job is to help fight infections.  Having a large tumor. Cancer is more apt to come back if the first tumor was bigger than 2 inches (5 cm).  Having certain types of breast cancer, such as:  Inflammatory breast cancer. This rare type grows rapidly and causes the breast to become red and swollen.  A high-grade tumor. The grade of a tumor indicates how fast it will grow and spread. High-grade tumors grow more quickly than  other types.  HER2 cancer. This refers to the tumor's genetic makeup. Tumors that have this type of gene are more likely to come back after treatment.  Having close tumor margins. This refers to the space between the tumor and normal, noncancerous cells. If the space is small, the tumor has a greater chance of coming back.  Having treatment involving a surgery to remove the tumor but not the entire breast (lumpectomy) and no radiation therapy. CARE AFTER BREAST CANCER Home Monitoring Women who have had breast cancer should continue to examine their breasts every month. The goal is to catch the cancer quickly if it comes back. Many women find it helpful to do so on the same day each month and to mark the calendar as a reminder. Let your caregiver know immediately if you have any signs of recurrent breast cancer. Symptoms will vary, depending on where the cancer recurs. The original type of treatment can also make a difference. Symptoms of local recurrence after a lumpectomy or a recurrence in the opposite breast may include:  A new lump or thickening in the breast.  A change in the way the skin looks on the breast (such as a rash, dimpling, or wrinkling).  Redness or swelling of the breast.  Changes in the nipple (such as being red, puckered, swollen, or leaking fluid). Symptoms of a recurrence after a breast removal surgery (mastectomy) may include:  A lump or thickening under the skin.  A thickening  around the mastectomy scar. Symptoms of regional recurrence in the lymph nodes near the breast may include:  A lump under the arm or above the collarbone.  Swelling of the arm.  Pain in the arm, shoulder, or chest.  Numbness in the hand or arm. Symptoms of distant recurrence may include:  A cough that does not go away.  Trouble breathing or shortness of breath.  Pain in the bones or the chest. This is pain that lasts or does not respond to rest and medicine.  Headaches.  Sudden  vision problems.  Dizziness.  Nausea or vomiting.  Losing weight without trying to.  Persistent abdominal pain.  Changes in bowel movements or blood in the stool.  Yellowing of the skin or eyes (jaundice).  Blood in the urine or bloody vaginal discharge. Clinical Monitoring  It is helpful to keep a schedule of appointments for needed tests and exams. This includes physical exams, breast exams, exams of the lymph nodes, and general exams.  For the first 3 years after being treated for breast cancer, see your caregiver every 3 to 6 months.  For years 4 and 5 after breast cancer, see your caregiver every 6 to 12 months.  After 5 years, see your caregiver at least once a year.  Regular breast X-rays (mammograms) should continue even if you had a mastectomy.  A mammogram should be done 1 year after the mammogram that first detected breast cancer.  A mammogram should be done every 6 to 12 months after that. Follow your caregiver's advice.  A pelvic exam done by your caregiver checks whether female organs are the normal size and shape. The exam is usually done every year. Ask your caregiver if that schedule is right for you.  Women taking tamoxifen should report any vaginal bleeding immediately to their caregiver. Tamoxifen is often given to women with a certain type of breast cancer. It has been shown to help prevent recurrence.  You will need to decide who your primary caregiver will be.  Most people continue to see their cancer specialist (oncologist) every 3 to 6 months for the first year after cancer treatment.  At some point, you may want to go back to seeing your family caregiver. You would no longer see your oncologist for regular checkups. Many women do this about 1 year after their first diagnosis of breast cancer.  You will still need to be seen every so often by your oncologist. Ask how often that should be. Coordinate this with your family or primary caregiver.  Think  about having genetic counseling. This would provide information on traits that can be passed or inherited from one generation to the next. In some cases, breast cancer runs in families. Tell your caregiver if you:  Are of Ashkenazi Jewish heritage.  Have any family member who has had ovarian cancer.  Have a mother, sister, or daughter who had breast cancer before age 65.  Have 2 or more close relatives who have had breast cancer. This means a mother, sister, daughter, aunt, or grandmother.  Had breast cancer in both breasts.  Have a female relative who has had breast cancer.  Some tests are not recommended for routine screening. Someone recovering from breast cancer does not need to have these tests if there are no problems. The tests have risks, such as radiation exposure, and can be costly. The risks of these tests are thought to be greater than the benefits:  Blood tests.  Chest X-rays.  Bone  scans.  Liver ultrasound.  Computed tomography (CT scan).  Positron emission tomography (PET scan).  Magnetic resonance imaging (MRI scan). DIAGNOSIS OF RECURRENT CANCER Recurrent breast cancer may be suspected for various reasons. A mammogram may not look normal. You might feel a lump or have other symptoms. Your caregiver may find something unusual during an exam. To be sure, your caregiver will probably order some tests. The tests are needed because there are symptoms or hints of a problem. They could include:  Blood tests, including a test to check how well the liver is working. The liver is a common site for a distant cancer recurrence.  Imaging tests that create pictures of the inside of the body. These tests include:  Chest X-rays to show if the cancer has come back in the lungs.  CT scans to create detailed pictures of various areas of the body and help find a distant recurrence.  MRI scans to find anything unusual in the breast, chest, or lymph nodes.  Breast ultrasound tests  to examine the breasts.  Bone scans to create a picture of your whole skeleton and find cancer in bony areas.  PET scans to create an image of the whole body. PET scans can be used together with CT scans to show more detail.  Biopsy. A small sample of tissue is taken and checked under a microscope. If cancer cells are found, they may be tested to see if they contain the HER2 gene or the hormones estrogen and progesterone. This will help your caregiver decide how to treat the recurrent cancer. TREATMENT  How recurrent breast cancer is treated depends on where the new cancer is found. The type of treatment that was used for the first breast cancer makes a difference, too. A combination of treatments may be used. Options include:  Surgery.  If the cancer comes back in the breast that was not treated before, you may need a lumpectomy or mastectomy.  If the cancer comes back in the breast that was treated before, you may need a mastectomy.  The lymph nodes under the arm may need to be removed.  Radiation therapy.  For a local recurrence, radiation may be used if it was not used during the first treatment.  For a distance recurrence, radiation is sometimes used.  Chemotherapy.  This may be used before surgery to treat recurrent breast cancer.  This may be used to treat recurrent cancer that cannot be treated with surgery.  This may be used to treat a distant recurrence.  Hormone therapy.  Women with the HER2 gene may be given hormone therapy to attack this gene. Document Released: 12/19/2010 Document Revised: 07/15/2011 Document Reviewed: 12/19/2010 [facility] Patient Information 2014 [place], [place].

## 2013-01-20 ENCOUNTER — Telehealth: Payer: Self-pay | Admitting: Oncology

## 2013-01-20 NOTE — Telephone Encounter (Signed)
, °

## 2013-01-24 NOTE — Progress Notes (Signed)
OFFICE PROGRESS NOTE  CC** Dr. Carlena Sax  Dr. Holley Bouche Dr. Everardo Beals  DIAGNOSIS:52 year old female with history of stage I a breast cancer (T1 B. N0 MX) diagnosed October 2011.   STAGE:  T1 BN 0 (stage IA)  Right breast  6.0 mm ER +2% PR +4% HER-2/neu positive    PRIOR THERAPY: #1patient who at the age of 89 had a mammogram performed that showed grouped/clustered calcifications at the 12:00 anterior position of the right breast. Ultrasound was negative. Patient had a stereotactic biopsy of the right breast in late August 2000 leptin. The biopsy showed a high-grade infiltrating ductal carcinoma arising in the background of high-grade intraductal Arseneau MOPP comedo type. She subsequently had a lumpectomy and sentinel lymph node biopsy on October 9. The tumor was found to be 6 mm node-negative ER 2% HER-2/neu positive PR 4%. Patient was recommended adjuvant chemotherapy consisting of carboplatinum and Taxol and Herceptin completed April 2012. She subsequently received radiation therapy adjuvantly to the right breast. She had maintenance Herceptin through February 2013  CURRENT THERAPY:observation  INTERVAL HISTORY: Martin Smeal 52 y.o. female returns for followup visit today. Overall she seems to be doing well. She has no nausea or vomiting no fevers chills or night sweats.her major of the template review of systems is negative.  MEDICAL HISTORY: Past Medical History  Diagnosis Date  . Breast cancer 2011    right breast    ALLERGIES:  has No Known Allergies.  MEDICATIONS:  Current Outpatient Prescriptions  Medication Sig Dispense Refill  . Cholecalciferol (VITAMIN D PO) Take by mouth once a week.      Marland Kitchen ibuprofen (ADVIL,MOTRIN) 200 MG tablet Take 800 mg by mouth every 6 (six) hours as needed for pain.       . Multiple Vitamins-Minerals (MULTI VITAMIN/MINERALS PO) Take by mouth.       No current facility-administered medications for this visit.     SURGICAL HISTORY:  Past Surgical History  Procedure Laterality Date  . Portacath placement  05/2010  . Tubal ligation  1991  . Biopsy breast  2011 and 1990's  . Right breast lumpectomy      2011  . Port-a-cath removal Left 12/31/2012    Procedure: REMOVAL PORT-A-CATH;  Surgeon: Almond Lint, MD;  Location: [facility];  Service: General;  Laterality: Left;    REVIEW OF SYSTEMS:  Pertinent items are noted in HPI.   HEALTH MAINTENANCE:  PHYSICAL EXAMINATION: Blood pressure 128/82, pulse 99, temperature 98 F (36.7 C), temperature source Oral, resp. rate 20, height 5\' 9"  (1.753 m), weight 191 lb 14.4 oz (87.045 kg), last menstrual period 12/17/2012. Body mass index is 28.33 kg/(m^2). ECOG PERFORMANCE STATUS: 0 - Asymptomatic   General appearance: alert, cooperative and appears stated age Neck: no adenopathy, no carotid bruit, no JVD, supple, symmetrical, trachea midline and thyroid not enlarged, symmetric, no tenderness/mass/nodules Lymph nodes: Cervical, supraclavicular, and axillary nodes normal. Resp: clear to auscultation bilaterally Back: symmetric, no curvature. ROM normal. No CVA tenderness. Cardio: regular rate and rhythm, S1, S2 normal, no murmur, click, rub or gallop GI: soft, non-tender; bowel sounds normal; no masses,  no organomegaly Extremities: extremities normal, atraumatic, no cyanosis or edema Neurologic: Grossly normal   LABORATORY DATA: Lab Results  Component Value Date   WBC 4.5 01/18/2013   HGB 12.7 01/18/2013   HCT 38.4 01/18/2013   MCV 88.7 01/18/2013   PLT 265 01/18/2013      Chemistry      Component Value Date/Time  NA 140 01/18/2013 1350   K 4.1 01/18/2013 1350   CL 105 07/09/2012 1457   CO2 28 01/18/2013 1350   BUN 11.9 01/18/2013 1350   CREATININE 0.9 01/18/2013 1350      Component Value Date/Time   CALCIUM 9.6 01/18/2013 1350   ALKPHOS 76 01/18/2013 1350   AST 24 01/18/2013 1350   ALT 16 01/18/2013 1350   BILITOT 0.27 01/18/2013  1350       RADIOGRAPHIC STUDIES:  No results found.  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 She underwent a right lumpectomy with sentinel lymph node biopsy. Her final pathology revealed a 6 mm node-negative breast cancer with associated DCIS tumor was ER +2% PR +4% and HER-2/neu positive. Patient went on to receive adjuvant chemotherapy consisting of Taxol carboplatinum and Herceptin with maintenance Herceptin completed in February 2013. She also is received radiation therapy to the breast. Overall she tolerated all of her therapy well. She has no evidence of recurrent disease  #2 patient has no evidence of recurrent disease   PLAN:   #1 patient will continue to be observed.  #2 I will see her back in 6 months time.  #3 she will update her on mammograms   All questions were answered. The patient knows to call the clinic with any problems, questions or concerns. We can certainly see the patient much sooner if necessary.  I spent 20 minutes counseling the patient face to face. The total time spent in the appointment was 25 minutes.    Drue Second, MD Medical/Oncology [facility] [phone] (beeper) [phone] (Office)

## 2013-02-01 ENCOUNTER — Ambulatory Visit (INDEPENDENT_AMBULATORY_CARE_PROVIDER_SITE_OTHER): Payer: Managed Care, Other (non HMO) | Admitting: General Surgery

## 2013-02-01 ENCOUNTER — Encounter (INDEPENDENT_AMBULATORY_CARE_PROVIDER_SITE_OTHER): Payer: Self-pay | Admitting: General Surgery

## 2013-02-01 VITALS — BP 126/70 | HR 72 | Temp 98.7°F | Resp 14 | Ht 69.0 in | Wt 193.2 lb

## 2013-02-01 DIAGNOSIS — C50919 Malignant neoplasm of unspecified site of unspecified female breast: Secondary | ICD-10-CM

## 2013-02-01 NOTE — Patient Instructions (Signed)
Try stretching, massage, heating pad for left arm.  Can also use BenGay or other such cream to help with discomfort.  If this does not resolve/significantly improve in 1-2 months, please give [place] a call.

## 2013-02-01 NOTE — Progress Notes (Signed)
HISTORY: Patient is four-week status post removal of left subclavian Port-A-Cath. She had had her breast cancer treatment in [place].  She is doing reasonably well overall except for some pain in her left upper arm.  She is not taking any Advil or Tylenol for this discomfort. She took very few pain pills overall.    EXAM: General:  Alert and oriented. Incision:  No significant hematoma.  No infection.     PATHOLOGY: n/a   ASSESSMENT AND PLAN:   Breast cancer No evidence of surgical complications.  Advised pt to use heat and massage/stretching for muscle pain in arm  Follow up as needed.        Maudry Diego, MD Surgical Oncology, General & Endocrine Surgery [facility]  No primary provider on file. No ref. provider found

## 2013-02-01 NOTE — Assessment & Plan Note (Signed)
No evidence of surgical complications.  Advised pt to use heat and massage/stretching for muscle pain in arm  Follow up as needed.

## 2013-03-11 ENCOUNTER — Other Ambulatory Visit: Payer: Self-pay

## 2013-07-23 ENCOUNTER — Other Ambulatory Visit: Payer: Managed Care, Other (non HMO)

## 2013-07-23 ENCOUNTER — Ambulatory Visit: Payer: Managed Care, Other (non HMO) | Admitting: Adult Health

## 2013-07-23 ENCOUNTER — Other Ambulatory Visit: Payer: Self-pay | Admitting: Oncology

## 2013-07-23 DIAGNOSIS — Z9889 Other specified postprocedural states: Secondary | ICD-10-CM

## 2013-07-23 DIAGNOSIS — Z853 Personal history of malignant neoplasm of breast: Secondary | ICD-10-CM

## 2013-08-16 ENCOUNTER — Ambulatory Visit
Admission: RE | Admit: 2013-08-16 | Discharge: 2013-08-16 | Disposition: A | Discharge status: Home / Self Care | Payer: 59 | Source: Ambulatory Visit | Attending: Oncology | Admitting: Oncology

## 2013-08-16 DIAGNOSIS — Z853 Personal history of malignant neoplasm of breast: Secondary | ICD-10-CM

## 2013-08-16 DIAGNOSIS — Z9889 Other specified postprocedural states: Secondary | ICD-10-CM

## 2013-08-24 ENCOUNTER — Other Ambulatory Visit: Payer: Self-pay | Admitting: Nurse Practitioner

## 2013-08-24 ENCOUNTER — Other Ambulatory Visit (HOSPITAL_COMMUNITY)
Admission: RE | Admit: 2013-08-24 | Discharge: 2013-08-24 | Disposition: A | Discharge status: Home / Self Care | Payer: 59 | Source: Ambulatory Visit | Attending: Nurse Practitioner | Admitting: Nurse Practitioner

## 2013-08-24 DIAGNOSIS — Z124 Encounter for screening for malignant neoplasm of cervix: Secondary | ICD-10-CM | POA: Insufficient documentation

## 2013-08-24 DIAGNOSIS — R87810 Cervical high risk human papillomavirus (HPV) DNA test positive: Secondary | ICD-10-CM | POA: Insufficient documentation

## 2013-08-24 DIAGNOSIS — Z1151 Encounter for screening for human papillomavirus (HPV): Secondary | ICD-10-CM | POA: Insufficient documentation

## 2013-09-13 ENCOUNTER — Other Ambulatory Visit: Payer: Self-pay | Admitting: Obstetrics and Gynecology

## 2014-02-18 ENCOUNTER — Other Ambulatory Visit: Payer: Self-pay

## 2014-07-18 ENCOUNTER — Other Ambulatory Visit: Payer: Self-pay | Admitting: Family Medicine

## 2014-07-18 DIAGNOSIS — Z853 Personal history of malignant neoplasm of breast: Secondary | ICD-10-CM

## 2014-07-18 DIAGNOSIS — Z9889 Other specified postprocedural states: Secondary | ICD-10-CM

## 2014-08-05 ENCOUNTER — Telehealth: Payer: Self-pay | Admitting: *Deleted

## 2014-08-05 NOTE — Telephone Encounter (Signed)
Received referral from [facility] office for f/u w/ Dr. Jana Hakim since Dr. Humphrey Rolls is no longer here.  Called pt and confirmed 08/31/14 appt w/ her.  Placed a note in EPIC to make sure pt fills out a packet at time of check in.  Manson Allan at referring office to make them aware.  Placed a copy of the records in Dr. Virgie Dad box and took one to HIM to scan.

## 2014-08-22 ENCOUNTER — Ambulatory Visit
Admission: RE | Admit: 2014-08-22 | Discharge: 2014-08-22 | Disposition: A | Discharge status: Home / Self Care | Payer: 59 | Source: Ambulatory Visit | Attending: Family Medicine | Admitting: Family Medicine

## 2014-08-22 DIAGNOSIS — Z853 Personal history of malignant neoplasm of breast: Secondary | ICD-10-CM

## 2014-08-22 DIAGNOSIS — Z9889 Other specified postprocedural states: Secondary | ICD-10-CM

## 2014-08-30 ENCOUNTER — Other Ambulatory Visit: Payer: Self-pay | Admitting: *Deleted

## 2014-08-30 DIAGNOSIS — C50919 Malignant neoplasm of unspecified site of unspecified female breast: Secondary | ICD-10-CM

## 2014-08-31 ENCOUNTER — Ambulatory Visit (HOSPITAL_BASED_OUTPATIENT_CLINIC_OR_DEPARTMENT_OTHER): Payer: 59 | Admitting: Oncology

## 2014-08-31 ENCOUNTER — Other Ambulatory Visit (HOSPITAL_BASED_OUTPATIENT_CLINIC_OR_DEPARTMENT_OTHER): Payer: 59

## 2014-08-31 VITALS — BP 137/73 | HR 86 | Temp 98.1°F | Resp 18 | Ht 69.0 in | Wt 183.2 lb

## 2014-08-31 DIAGNOSIS — D2272 Melanocytic nevi of left lower limb, including hip: Secondary | ICD-10-CM

## 2014-08-31 DIAGNOSIS — Z853 Personal history of malignant neoplasm of breast: Secondary | ICD-10-CM

## 2014-08-31 DIAGNOSIS — K148 Other diseases of tongue: Secondary | ICD-10-CM

## 2014-08-31 DIAGNOSIS — C50919 Malignant neoplasm of unspecified site of unspecified female breast: Secondary | ICD-10-CM

## 2014-08-31 LAB — CBC WITH DIFFERENTIAL/PLATELET
BASO%: 0.7 % (ref 0.0–2.0)
BASOS ABS: 0 10*3/uL (ref 0.0–0.1)
EOS%: 1.7 % (ref 0.0–7.0)
Eosinophils Absolute: 0.1 10*3/uL (ref 0.0–0.5)
HCT: 37.4 % (ref 34.8–46.6)
HGB: 12 g/dL (ref 11.6–15.9)
LYMPH%: 28.4 % (ref 14.0–49.7)
MCH: 28.2 pg (ref 25.1–34.0)
MCHC: 32 g/dL (ref 31.5–36.0)
MCV: 88.2 fL (ref 79.5–101.0)
MONO#: 0.3 10*3/uL (ref 0.1–0.9)
MONO%: 5 % (ref 0.0–14.0)
NEUT#: 3.7 10*3/uL (ref 1.5–6.5)
NEUT%: 64.2 % (ref 38.4–76.8)
PLATELETS: 278 10*3/uL (ref 145–400)
RBC: 4.24 10*6/uL (ref 3.70–5.45)
RDW: 13.9 % (ref 11.2–14.5)
WBC: 5.8 10*3/uL (ref 3.9–10.3)
lymph#: 1.6 10*3/uL (ref 0.9–3.3)

## 2014-08-31 LAB — COMPREHENSIVE METABOLIC PANEL (CC13)
ALT: 14 U/L (ref 0–55)
ANION GAP: 14 meq/L — AB (ref 3–11)
AST: 21 U/L (ref 5–34)
Albumin: 3.7 g/dL (ref 3.5–5.0)
Alkaline Phosphatase: 93 U/L (ref 40–150)
BUN: 10.5 mg/dL (ref 7.0–26.0)
CALCIUM: 9.4 mg/dL (ref 8.4–10.4)
CHLORIDE: 106 meq/L (ref 98–109)
CO2: 20 meq/L — AB (ref 22–29)
CREATININE: 0.9 mg/dL (ref 0.6–1.1)
EGFR: 82 mL/min/{1.73_m2} — AB (ref >90)
GLUCOSE: 140 mg/dL (ref 70–140)
Potassium: 3.7 mEq/L (ref 3.5–5.1)
Sodium: 139 mEq/L (ref 136–145)
Total Protein: 7.5 g/dL (ref 6.4–8.3)

## 2014-08-31 MED ORDER — TAMOXIFEN CITRATE 20 MG PO TABS: 20.0000 mg | ORAL_TABLET | Freq: Every day | ORAL | Status: AC

## 2014-08-31 NOTE — Progress Notes (Signed)
[place]  Telephone:([phone]) [phone] Fax:([phone]) [phone]     ID: Audrey Friedman DOB: [birth year]  MR#: [id number]  FXJ#:[id number]  Patient Care Team: Carol Ada, MD as PCP - General (Family Medicine) Thurnell Lose, MD as Consulting Physician (Obstetrics and Gynecology) Chauncey Cruel, MD as Consulting Physician (Oncology) PCP: Reginia Naas, MD OTHER MD:  CHIEF COMPLAINT: Right-sided breast cancer  CURRENT TREATMENT: Observation   BREAST CANCER HISTORY: From Dr.Kalsoom Khan:s summary note:  " #1patient who at the age of 85 had a mammogram performed that showed grouped/clustered calcifications at the 12:00 anterior position of the right breast. Ultrasound was negative. Patient had a stereotactic biopsy of the right breast in late August 2000 leptin. The biopsy showed a high-grade infiltrating ductal carcinoma arising in the background of high-grade intraductal Arseneau MOPP comedo type. She subsequently had a lumpectomy and sentinel lymph node biopsy on October 9. The tumor was found to be 6 mm node-negative ER 2% HER-2/neu positive PR 4%. Patient was recommended adjuvant chemotherapy consisting of carboplatinum and Taxol and Herceptin completed April 2012. She subsequently received radiation therapy adjuvantly to the right breast. She had maintenance Herceptin through February 2013."  Her subsequent history is as detailed below  INTERVAL HISTORY: Audrey Friedman was evaluated in the breast clinic for 20 11/23/2014. She established herself in my service at that time.  REVIEW OF SYSTEMS: She has some low back pain which "comes and goes" and is not more intense or persistent than a year or 2 ago. She has a sore throat, hoarseness, and a dry cough, all of which she attributes, probably correctly, to sinuses. She is not on an antihistamine for this. She has some stress urinary incontinence. She has a skin "spot" on her left lower leg that she wanted me to look at. A  detailed review of systems today was otherwise stable.  PAST MEDICAL HISTORY: Past Medical History  Diagnosis Date  . Breast cancer 2011    right breast    PAST SURGICAL HISTORY: Past Surgical History  Procedure Laterality Date  . Portacath placement  05/2010  . Tubal ligation  1991  . Biopsy breast  2011 and 1990's  . Right breast lumpectomy      2011  . Port-a-cath removal Left 12/31/2012    Procedure: REMOVAL PORT-A-CATH;  Surgeon: Stark Klein, MD;  Location: [facility];  Service: General;  Laterality: Left;    FAMILY HISTORY Family History  Problem Relation Age of Onset  . Throat cancer Mother     smoker  . Cancer Mother     throat  . Cancer Maternal Grandmother     unknown cancer  . HIV/AIDS Brother   . Aneurysm Brother    the patient's father died in his 47s, from causes unknown to the patient. The patient's mother died at age 41 from a cerebral bleed. She had a history of throat cancer. The maternal grandmother was diagnosed with breast cancer at an age unknown to the patient. The patient has 3 brothers, 2 sisters. There is no other breast or ovarian cancer history in the family to her knowledge.  GYNECOLOGIC HISTORY:  Patient's last menstrual period was 08/22/2014. Menarche age 16, first live birth age 29, the patient is [place] P2. She is still having regular periods. She never used oral contraceptives  . 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 She is divorced and lives alone, with no pets. Her daughter Audrey Friedman lives in [place] and administers a  home healthcare service. Daughter Audrey Friedman lives in [place] where she is graduating this year and will start working as a Air traffic controller. The patient has one granddaughter. She is not a church attender    ADVANCED DIRECTIVES: Not in place; at the 08/31/2014 visit the patient was given appropriate documents 2 complete and notarize at her discretion   HEALTH  MAINTENANCE: History  Substance Use Topics  . Smoking status: Never Smoker   . Smokeless tobacco: Never Used  . Alcohol Use: No     Colonoscopy: 2015  PAP: 09/13/2013/ [facility]  Bone density:  Lipid panel:  No Known Allergies  Current Outpatient Prescriptions  Medication Sig Dispense Refill  . Biotin 5000 MCG TABS Take by mouth.    . Cholecalciferol (VITAMIN D PO) Take by mouth once a week.    Marland Kitchen ibuprofen (ADVIL,MOTRIN) 200 MG tablet Take 800 mg by mouth every 6 (six) hours as needed for pain.     . Multiple Vitamins-Minerals (MULTI VITAMIN/MINERALS PO) Take by mouth.    . tamoxifen (NOLVADEX) 20 MG tablet Take 1 tablet (20 mg total) by mouth daily. 90 tablet 12   No current facility-administered medications for this visit.    OBJECTIVE: Middle-aged African-American woman in no acute distress Filed Vitals:   08/31/14 1619  BP: 137/73  Pulse: 86  Temp: 98.1 F (36.7 C)  Resp: 18     Body mass index is 27.04 kg/(m^2).    ECOG FS:1 - Symptomatic but completely ambulatory  Ocular: Sclerae unicteric, pupils round and equal Ear-nose-throat: Oropharynx clear, dentition in good repair Lymphatic: No cervical or supraclavicular adenopathy Lungs no rales or rhonchi, good excursion bilaterally Heart regular rate and rhythm, no murmur appreciated Abd soft, nontender, positive bowel sounds MSK no focal spinal tenderness, no right upper extremity lymphedema Neuro: non-focal, well-oriented, appropriate affect Breasts: The right breast is status post lumpectomy and radiation. There is no evidence of local recurrence. The right axilla is benign. The left breast is unremarkable Skin: There is a nonpalpable [place] Shark mole in the lower left leg anteriorly, with scalloped edges.   LAB RESULTS:  CMP     Component Value Date/Time   NA 139 08/31/2014 1557   K 3.7 08/31/2014 1557   CL 105 07/09/2012 1457   CO2 20* 08/31/2014 1557   GLUCOSE 140 08/31/2014 1557   GLUCOSE 83 07/09/2012 1457     BUN 10.5 08/31/2014 1557   CREATININE 0.9 08/31/2014 1557   CALCIUM 9.4 08/31/2014 1557   PROT 7.5 08/31/2014 1557   ALBUMIN 3.7 08/31/2014 1557   AST 21 08/31/2014 1557   ALT 14 08/31/2014 1557   ALKPHOS 93 08/31/2014 1557   BILITOT <0.20 08/31/2014 1557    INo results found for: SPEP, UPEP  Lab Results  Component Value Date   WBC 5.8 08/31/2014   NEUTROABS 3.7 08/31/2014   HGB 12.0 08/31/2014   HCT 37.4 08/31/2014   MCV 88.2 08/31/2014   PLT 278 08/31/2014      Chemistry      Component Value Date/Time   NA 139 08/31/2014 1557   K 3.7 08/31/2014 1557   CL 105 07/09/2012 1457   CO2 20* 08/31/2014 1557   BUN 10.5 08/31/2014 1557   CREATININE 0.9 08/31/2014 1557      Component Value Date/Time   CALCIUM 9.4 08/31/2014 1557   ALKPHOS 93 08/31/2014 1557   AST 21 08/31/2014 1557   ALT 14 08/31/2014 1557   BILITOT <0.20 08/31/2014 1557  Lab Results  Component Value Date   LABCA2 15 07/09/2012    No components found for: [id number]  No results for input(s): INR in the last 168 hours.  Urinalysis No results found for: COLORURINE, APPEARANCEUR, LABSPEC, [place], GLUCOSEU, HGBUR, BILIRUBINUR, KETONESUR, PROTEINUR, UROBILINOGEN, NITRITE, LEUKOCYTESUR  STUDIES: Mm Diag Breast Tomo Bilateral  08/22/2014   CLINICAL DATA:  History of malignant lumpectomy of the right breast in 2011. Annual re-evaluation.  EXAM: DIGITAL DIAGNOSTIC bilateral MAMMOGRAM WITH 3D TOMOSYNTHESIS AND CAD  COMPARISON:  Previous examinations the most recent of which is dated 08/06/2013.  ACR Breast Density Category b: There are scattered areas of fibroglandular density.  FINDINGS: There are stable scarring changes located superior right breast related to the patient's lumpectomy. There is no specific evidence for recurrent tumor or developing malignancy within either breast.  Mammographic images were processed with CAD.  IMPRESSION: No findings worrisome for recurrent tumor or developing  malignancy.  RECOMMENDATION: Bilateral diagnostic mammography in 1 year.  I have discussed the findings and recommendations with the patient. Results were also provided in writing at the conclusion of the visit. If applicable, a reminder letter will be sent to the patient regarding the next appointment.  BI-RADS CATEGORY  1: Negative.   Electronically Signed   By: Altamese Cabal M.D.   On: 08/22/2014 15:44    ASSESSMENT: 54 y.o. Audrey Friedman woman status post right breast biopsy 01/01/2010 for an invasive ductal carcinoma, high-grade, estrogen receptor 4% positive, progesterone receptor 2% positive, with an MIB-1 of 34% and HER-2 positive by immunohistochemistry (3+. There was also amplification by [facility] ([place])  (1) Status post right lumpectomy and sentinel lymph node sampling 02/21/2010 for a 0.3 cm invasive ductal carcinoma, intermediate to high-grade, with negative margins and the single sentinel lymph node negative (pT1a pN0, stage IA)--([id number])  (2) adjuvant chemotherapy reportedly consisted of carboplatin, paclitaxel and Herceptin completed April 2012  (a) Herceptin was continued through February 2013  (3) adjuvant radiation completed August 2012  (4) patient did not receiv adjuvant antiestrogen therapy  (5) genetic testing pending  (6) consider prophylactic tamoxifen  (7) brown mole with scalloped edges in lower leg: patient requests dermatologic referral  PLAN: I spent approximately one hour with Audrey Friedman reviewing her diagnosis, treatment history, and prognosis. I think if she had been diagnosed today she would have been treated a little less aggressively, and in particular would not have received the carboplatin. Also possibly she would have been more encouraged to receive anti-estrogens.  However I reassured her that as far as her original breast cancer is concerned, she is pretty much done with it. Her risk of recurrence is never going to be 0 but  is certainly very close to that. For that reason I am comfortable releasing her to her primary care physician.  There are however at 2 issues to discuss. First, she was 55 years old at the time of initial diagnosis so she qualifies for genetic testing. She is interested in pursuing this and I will set that up for her within the next month or so. We discussed the fact that if she proves to carry a deleterious mutation she does not have to proceed to bilateral mastectomies although she might choose that, but she certainly would need bilateral salpingo-oophorectomy.  Secondly, she can consider anti-estrogens, and particularly tamoxifen, prophylactically. If her risk of recurrence is somewhere between 0.75 and 1% per year, then assuming she lives to be 82 that is a 20-30% chance of developing  another breast cancer in her lifetime. Tamoxifen would cut that risk in half.  We discussed the possible toxicities side effects and complications not only of tamoxifen but also of the aromatase inhibitors.  Audrey Friedman could not make up her mind regarding that today. I did go ahead and place a prescription for her at her pharmacy. If she does decide to give tamoxifen a try, she will call and see me about 3 months after starting. I just want to make sure that she would be tolerating it well. If she decides against it, then I feel no further follow-up here as needed although she knows that we'll be glad to see her at any point in the future if the need arises.  As far as breast cancer screening is concerned all she needs is yearly mammography and a yearly physician breast exam   Chauncey Cruel, MD   08/31/2014 5:17 PM Medical Oncology and Hematology [facility] [place], [place] [address] Tel. [phone]    Fax. [phone]

## 2014-09-01 ENCOUNTER — Other Ambulatory Visit: Payer: Self-pay | Admitting: Oncology

## 2014-09-01 ENCOUNTER — Telehealth: Payer: Self-pay | Admitting: Oncology

## 2014-09-01 NOTE — Telephone Encounter (Signed)
lvm for pt regarding to May and JULY appt.Marland KitchenMarland KitchenMarland KitchenMay 18@ 2:30pm with Dr. Renda Rolls.Marland KitchenMarland KitchenMarland Kitchen

## 2014-09-05 ENCOUNTER — Telehealth: Payer: Self-pay | Admitting: Nurse Practitioner

## 2014-09-05 NOTE — Telephone Encounter (Signed)
Returned Advertising account executive. Patient reschedule to 06/06. Patient confirmed.

## 2014-09-06 ENCOUNTER — Other Ambulatory Visit (HOSPITAL_COMMUNITY)
Admission: RE | Admit: 2014-09-06 | Discharge: 2014-09-06 | Disposition: A | Discharge status: Home / Self Care | Payer: 59 | Source: Ambulatory Visit | Attending: Obstetrics and Gynecology | Admitting: Obstetrics and Gynecology

## 2014-09-06 ENCOUNTER — Other Ambulatory Visit: Payer: Self-pay | Admitting: Obstetrics and Gynecology

## 2014-09-06 DIAGNOSIS — Z1151 Encounter for screening for human papillomavirus (HPV): Secondary | ICD-10-CM | POA: Insufficient documentation

## 2014-09-06 DIAGNOSIS — Z01419 Encounter for gynecological examination (general) (routine) without abnormal findings: Secondary | ICD-10-CM | POA: Insufficient documentation

## 2014-09-07 ENCOUNTER — Other Ambulatory Visit: Payer: 59

## 2014-09-07 ENCOUNTER — Encounter: Payer: 59 | Admitting: Genetic Counselor

## 2014-09-08 LAB — CYTOLOGY - PAP

## 2014-10-10 ENCOUNTER — Encounter: Payer: 59 | Admitting: Genetic Counselor

## 2014-10-10 ENCOUNTER — Other Ambulatory Visit: Payer: 59

## 2014-11-23 ENCOUNTER — Ambulatory Visit: Payer: 59 | Admitting: Nurse Practitioner

## 2015-07-28 ENCOUNTER — Other Ambulatory Visit: Payer: Self-pay | Admitting: Oncology

## 2015-08-24 ENCOUNTER — Encounter: Payer: Self-pay | Admitting: Genetic Counselor

## 2015-08-24 ENCOUNTER — Telehealth: Payer: Self-pay | Admitting: Genetic Counselor

## 2015-08-24 NOTE — Telephone Encounter (Signed)
Verified address and insurance, contacted referring office regarding appt and demographics, mailed new pt packet.

## 2015-09-21 ENCOUNTER — Encounter: Payer: Self-pay | Admitting: Genetic Counselor

## 2015-09-27 ENCOUNTER — Other Ambulatory Visit: Payer: 59

## 2015-09-27 ENCOUNTER — Encounter: Payer: 59 | Admitting: Genetic Counselor

## 2015-10-04 ENCOUNTER — Other Ambulatory Visit: Payer: Self-pay | Admitting: Family Medicine

## 2015-10-04 DIAGNOSIS — Z9889 Other specified postprocedural states: Secondary | ICD-10-CM

## 2015-10-04 DIAGNOSIS — Z853 Personal history of malignant neoplasm of breast: Secondary | ICD-10-CM

## 2015-10-13 ENCOUNTER — Ambulatory Visit
Admission: RE | Admit: 2015-10-13 | Discharge: 2015-10-13 | Disposition: A | Discharge status: Home / Self Care | Payer: 59 | Source: Ambulatory Visit | Attending: Family Medicine | Admitting: Family Medicine

## 2015-10-13 DIAGNOSIS — Z9889 Other specified postprocedural states: Secondary | ICD-10-CM

## 2015-10-13 DIAGNOSIS — Z853 Personal history of malignant neoplasm of breast: Secondary | ICD-10-CM

## 2015-11-17 ENCOUNTER — Other Ambulatory Visit: Payer: Self-pay | Admitting: Family Medicine

## 2015-11-17 ENCOUNTER — Ambulatory Visit
Admission: RE | Admit: 2015-11-17 | Discharge: 2015-11-17 | Disposition: A | Discharge status: Home / Self Care | Payer: 59 | Source: Ambulatory Visit | Attending: Family Medicine | Admitting: Family Medicine

## 2015-11-17 DIAGNOSIS — S90122A Contusion of left lesser toe(s) without damage to nail, initial encounter: Secondary | ICD-10-CM

## 2015-11-20 ENCOUNTER — Encounter: Payer: 59 | Admitting: Genetic Counselor

## 2015-11-20 ENCOUNTER — Other Ambulatory Visit: Payer: 59

## 2016-10-25 ENCOUNTER — Other Ambulatory Visit: Payer: Self-pay | Admitting: Family Medicine

## 2016-10-25 DIAGNOSIS — Z9889 Other specified postprocedural states: Secondary | ICD-10-CM

## 2016-10-25 DIAGNOSIS — Z853 Personal history of malignant neoplasm of breast: Secondary | ICD-10-CM

## 2016-10-29 ENCOUNTER — Other Ambulatory Visit: Payer: Self-pay | Admitting: Obstetrics and Gynecology

## 2016-10-29 DIAGNOSIS — Z853 Personal history of malignant neoplasm of breast: Secondary | ICD-10-CM

## 2016-10-29 DIAGNOSIS — Z9889 Other specified postprocedural states: Secondary | ICD-10-CM

## 2016-10-30 ENCOUNTER — Other Ambulatory Visit: Payer: Self-pay | Admitting: Family Medicine

## 2016-10-30 ENCOUNTER — Ambulatory Visit
Admission: RE | Admit: 2016-10-30 | Discharge: 2016-10-30 | Disposition: A | Discharge status: Home / Self Care | Payer: 59 | Source: Ambulatory Visit | Attending: Family Medicine | Admitting: Family Medicine

## 2016-10-30 DIAGNOSIS — Z9889 Other specified postprocedural states: Secondary | ICD-10-CM

## 2016-10-30 DIAGNOSIS — N6320 Unspecified lump in the left breast, unspecified quadrant: Secondary | ICD-10-CM

## 2016-10-30 DIAGNOSIS — Z853 Personal history of malignant neoplasm of breast: Secondary | ICD-10-CM

## 2016-11-04 ENCOUNTER — Ambulatory Visit
Admission: RE | Admit: 2016-11-04 | Discharge: 2016-11-04 | Disposition: A | Discharge status: Home / Self Care | Payer: 59 | Source: Ambulatory Visit | Attending: Family Medicine | Admitting: Family Medicine

## 2016-11-04 ENCOUNTER — Other Ambulatory Visit: Payer: Self-pay | Admitting: Family Medicine

## 2016-11-04 DIAGNOSIS — N6320 Unspecified lump in the left breast, unspecified quadrant: Secondary | ICD-10-CM

## 2016-11-04 DIAGNOSIS — Z853 Personal history of malignant neoplasm of breast: Secondary | ICD-10-CM

## 2016-11-04 DIAGNOSIS — Z9889 Other specified postprocedural states: Secondary | ICD-10-CM

## 2016-11-05 ENCOUNTER — Other Ambulatory Visit: Payer: Self-pay | Admitting: Family Medicine

## 2016-11-05 DIAGNOSIS — N6320 Unspecified lump in the left breast, unspecified quadrant: Secondary | ICD-10-CM

## 2017-03-19 IMAGING — CR DG TOE 2ND 2+V*L*
3 series · 3 of 3 positions shown · non-contrast
Comparison: None.

CLINICAL DATA: Left second toe pain after injury 1 week ago.
Initial encounter.

EXAM:
LEFT SECOND TOE

[t toes ap left]
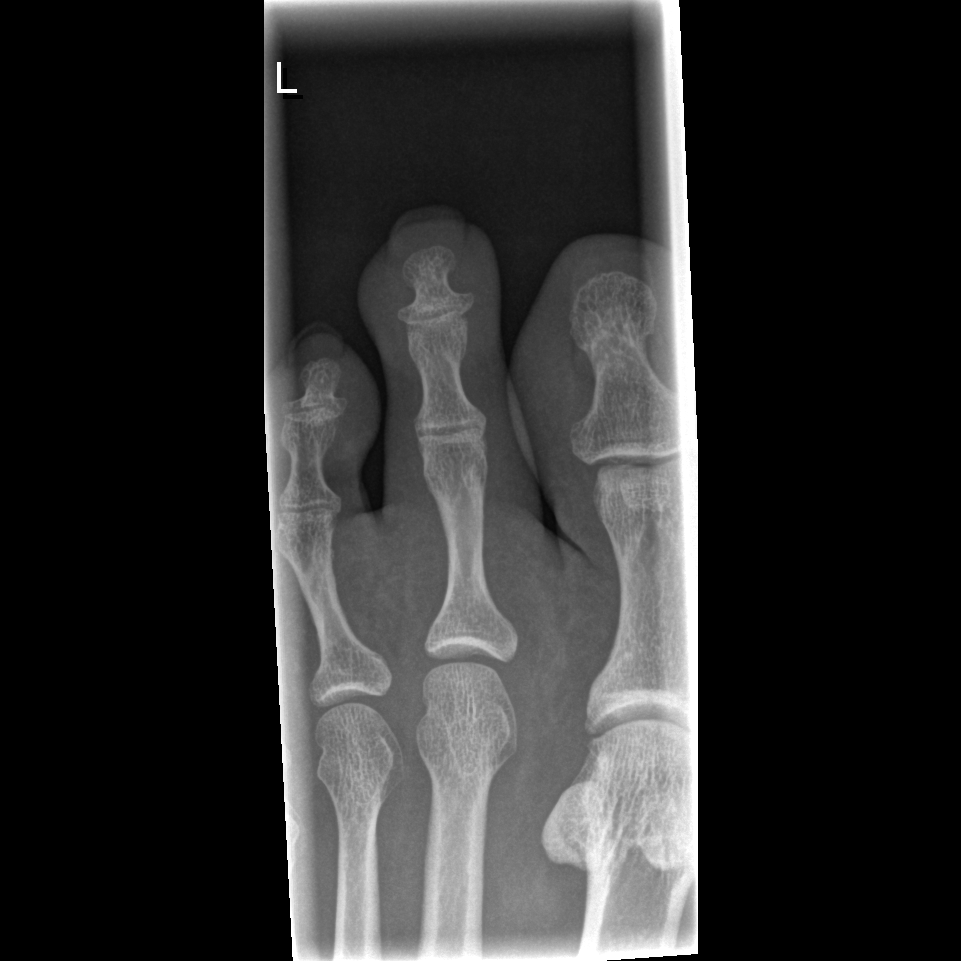

[t toes oblique left]
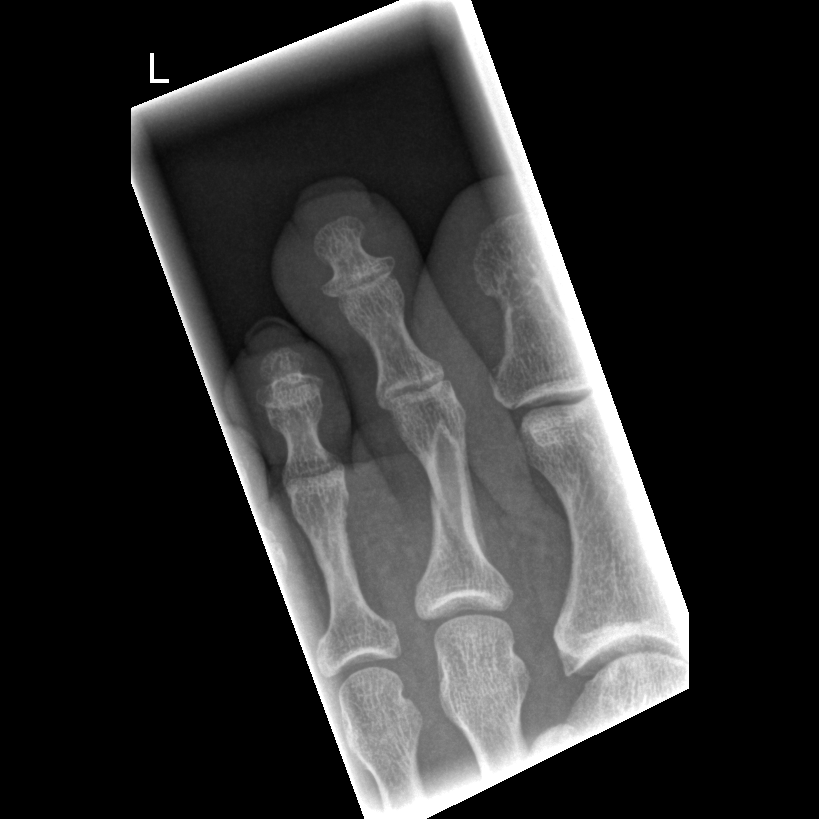

[t toes lateral left]
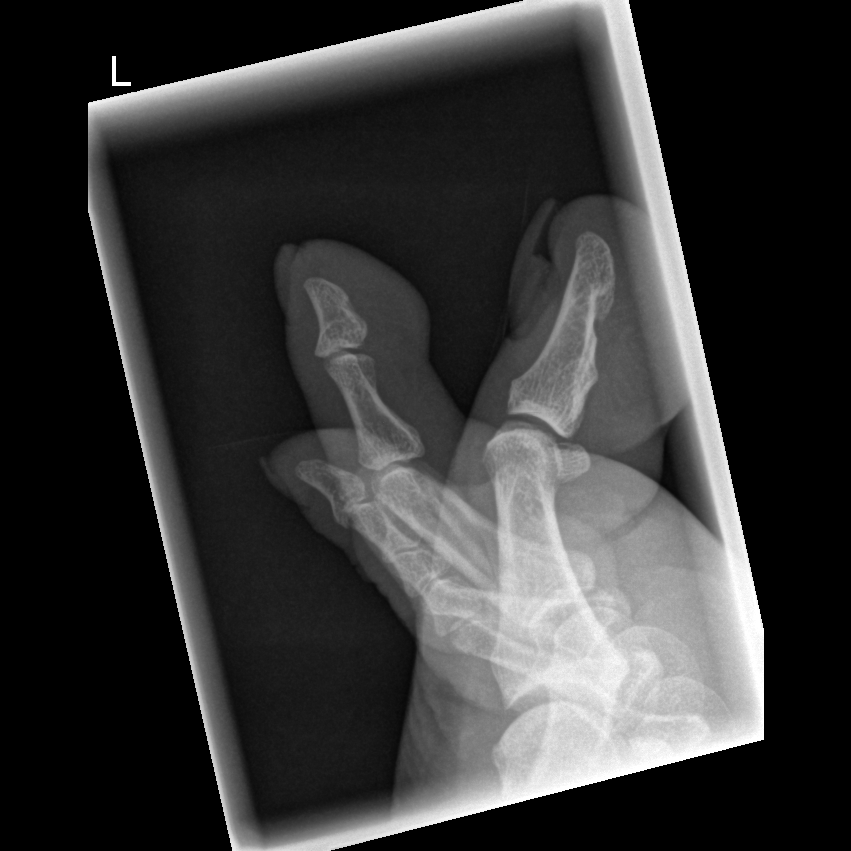

[3 of 3 positions shown; findings below may reference images not displayed]

FINDINGS: Mildly displaced oblique fracture is seen involving the second
proximal phalanx. This appears to be closed and posttraumatic. Joint
spaces are intact. Soft tissues are unremarkable.
IMPRESSION: Mildly displaced second proximal phalangeal fracture.

## 2017-04-10 ENCOUNTER — Other Ambulatory Visit: Payer: Self-pay | Admitting: Family Medicine

## 2017-04-10 ENCOUNTER — Ambulatory Visit
Admission: RE | Admit: 2017-04-10 | Discharge: 2017-04-10 | Disposition: A | Discharge status: Home / Self Care | Payer: 59 | Source: Ambulatory Visit | Attending: Family Medicine | Admitting: Family Medicine

## 2017-04-10 ENCOUNTER — Ambulatory Visit: Payer: 59

## 2017-04-10 DIAGNOSIS — N6320 Unspecified lump in the left breast, unspecified quadrant: Secondary | ICD-10-CM

## 2017-05-02 ENCOUNTER — Other Ambulatory Visit: Payer: 59

## 2017-11-10 ENCOUNTER — Other Ambulatory Visit: Payer: 59

## 2018-05-06 ENCOUNTER — Encounter: Payer: Self-pay | Admitting: Emergency Medicine

## 2018-05-06 ENCOUNTER — Emergency Department (INDEPENDENT_AMBULATORY_CARE_PROVIDER_SITE_OTHER)
Admission: EM | Admit: 2018-05-06 | Discharge: 2018-05-06 | Disposition: A | Discharge status: Home / Self Care | Payer: Managed Care, Other (non HMO) | Source: Home / Self Care | Attending: Family Medicine | Admitting: Family Medicine

## 2018-05-06 ENCOUNTER — Other Ambulatory Visit (HOSPITAL_COMMUNITY)
Admission: RE | Admit: 2018-05-06 | Discharge: 2018-05-06 | Disposition: A | Discharge status: Home / Self Care | Payer: Managed Care, Other (non HMO) | Source: Ambulatory Visit | Attending: Family Medicine | Admitting: Family Medicine

## 2018-05-06 DIAGNOSIS — N898 Other specified noninflammatory disorders of vagina: Secondary | ICD-10-CM | POA: Insufficient documentation

## 2018-05-06 MED ORDER — METRONIDAZOLE 500 MG PO TABS: 500.0000 mg | ORAL_TABLET | Freq: Two times a day (BID) | ORAL | 0 refills | Status: DC

## 2018-05-06 NOTE — ED Triage Notes (Signed)
Pt c/o vaginal odor x2 months that is worse around her cycle. She has tried boric acid and monistat with no relief. Denies pain.

## 2018-05-06 NOTE — Discharge Instructions (Signed)
°  Your symptoms are likely due to bacterial vaginosis. You may start the prescribed flagyl while waiting on the test results. Results typically come back in 2-3 days. Please follow up with your gynecologist as needed.  Do not drink alcohol while taking metronidazole (Flagyl) as it may cause severe nausea and vomiting.

## 2018-05-06 NOTE — ED Provider Notes (Signed)
[facility]    CSN: [id number] Arrival date & time: 05/06/18  1707     History   Chief Complaint Chief Complaint  Patient presents with  . Vaginitis    HPI Audrey Friedman is a 58 y.o. female.   HPI Audrey Friedman is a 58 y.o. female presenting to UC with c/o vaginal odor for 2 months that is worse after her menstrual cycle.  She has tried boric acid and monistat w/o relief.  Denies vaginal pain, abdominal pain or back pain. No dysuria. Low concern for STDs but does not mind being tested.  No new soaps, lotions, medications.  Hx of BV and yeast infections in the past.    Past Medical History:  Diagnosis Date  . Breast cancer [facility]) 2011   right breast    Patient Active Problem List   Diagnosis Date Noted  . Breast cancer, right breast [facility])     Past Surgical History:  Procedure Laterality Date  . BIOPSY BREAST  2011 and 1990's  . BREAST LUMPECTOMY Right    nov. 2011 radiation and chemo  . PORT-A-CATH REMOVAL Left 12/31/2012   Procedure: REMOVAL PORT-A-CATH;  Surgeon: Stark Klein, MD;  Location: [place];  Service: General;  Laterality: Left;  . PORTACATH PLACEMENT  05/2010  . right breast lumpectomy     2011  . TUBAL LIGATION  1991    OB History   No obstetric history on file.      Home Medications    Prior to Admission medications   Medication Sig Start Date End Date Taking? Authorizing Provider  Biotin 5000 MCG TABS Take by mouth.    [provider]  Cholecalciferol (VITAMIN D PO) Take by mouth once a week.    [provider]  ibuprofen (ADVIL,MOTRIN) 200 MG tablet Take 800 mg by mouth every 6 (six) hours as needed for pain.     [provider]  metroNIDAZOLE (FLAGYL) 500 MG tablet Take 1 tablet (500 mg total) by mouth 2 (two) times daily. 05/06/18   Noe Gens, PA-C  Multiple Vitamins-Minerals (MULTI VITAMIN/MINERALS PO) Take by mouth.    [provider]    Family  History Family History  Problem Relation Age of Onset  . Throat cancer Mother        smoker  . Cancer Mother        throat  . Cancer Maternal Grandmother        unknown cancer  . HIV/AIDS Brother   . Aneurysm Brother     Social History Social History   Tobacco Use  . Smoking status: Never Smoker  . Smokeless tobacco: Never Used  Substance Use Topics  . Alcohol use: No  . Drug use: No     Allergies   Patient has no known allergies.   Review of Systems Review of Systems  Genitourinary: Positive for vaginal discharge. Negative for dysuria, flank pain, pelvic pain, vaginal bleeding and vaginal pain.  Musculoskeletal: Negative for back pain and myalgias.     Physical Exam Triage Vital Signs ED Triage Vitals  Enc Vitals Group     BP 05/06/18 1746 119/69     Pulse Rate 05/06/18 1746 76     Resp --      Temp 05/06/18 1746 98 F (36.7 C)     Temp Source 05/06/18 1746 Oral     SpO2 05/06/18 1746 97 %     Weight 05/06/18 1747 205 lb (93 kg)  Height --      Head Circumference --      Peak Flow --      Pain Score 05/06/18 1746 0     Pain Loc --      Pain Edu? --      Excl. in [place]? --    No data found.  Updated Vital Signs BP 119/69 (BP Location: Right Arm)   Pulse 76   Temp 98 F (36.7 C) (Oral)   Wt 205 lb (93 kg)   SpO2 97%   BMI 30.27 kg/m   Visual Acuity Right Eye Distance:   Left Eye Distance:   Bilateral Distance:    Right Eye Near:   Left Eye Near:    Bilateral Near:     Physical Exam Vitals signs and nursing note reviewed. Exam conducted with a chaperone present.  Constitutional:      Appearance: Normal appearance. She is well-developed.  HENT:     Head: Normocephalic and atraumatic.  Neck:     Musculoskeletal: Normal range of motion.  Cardiovascular:     Rate and Rhythm: Normal rate and regular rhythm.  Pulmonary:     Effort: Pulmonary effort is normal.     Breath sounds: Normal breath sounds.  Abdominal:     General: Bowel  sounds are normal. There is no distension.     Palpations: Abdomen is soft.     Tenderness: There is no abdominal tenderness. There is no right CVA tenderness or left CVA tenderness.  Genitourinary:    Vagina: Vaginal discharge (scant clear-white malodorous) present. No bleeding.     Cervix: Normal.     Uterus: Normal.      Adnexa: Right adnexa normal and left adnexa normal.  Musculoskeletal: Normal range of motion.  Skin:    General: Skin is warm and dry.  Neurological:     Mental Status: She is alert and oriented to person, place, and time.  Psychiatric:        Behavior: Behavior normal.      UC Treatments / Results  Labs (all labs ordered are listed, but only abnormal results are displayed) Labs Reviewed  CERVICOVAGINAL ANCILLARY ONLY    EKG None  Radiology No results found.  Procedures Procedures (including critical care time)  Medications Ordered in UC Medications - No data to display  Initial Impression / Assessment and Plan / UC Course  I have reviewed the triage vital signs and the nursing notes.  Pertinent labs & imaging results that were available during my care of the patient were reviewed by me and considered in my medical decision making (see chart for details).     Vaginal swab sent to lab for wet prep and gc/chlamydia Will start pt on flagyl while labs pending. Low suspicious for yeast based on exam and failure to improve after home tx.   Final Clinical Impressions(s) / UC Diagnoses   Final diagnoses:  Vaginal discharge     Discharge Instructions      Your symptoms are likely due to bacterial vaginosis. You may start the prescribed flagyl while waiting on the test results. Results typically come back in 2-3 days. Please follow up with your gynecologist as needed.  Do not drink alcohol while taking metronidazole (Flagyl) as it may cause severe nausea and vomiting.      ED Prescriptions    Medication Sig Dispense Auth. Provider    metroNIDAZOLE (FLAGYL) 500 MG tablet Take 1 tablet (500 mg total) by mouth 2 (two) times  daily. 14 tablet Noe Gens, [place]     Controlled Substance Prescriptions [place] Controlled Substance Registry consulted? Not Applicable   Tyrell Antonio 05/06/18 1898

## 2018-05-08 LAB — CERVICOVAGINAL ANCILLARY ONLY
Bacterial vaginitis: POSITIVE — AB
Candida vaginitis: NEGATIVE
Chlamydia: NEGATIVE
Neisseria Gonorrhea: NEGATIVE
Trichomonas: NEGATIVE

## 2018-05-09 ENCOUNTER — Telehealth: Payer: Self-pay | Admitting: Emergency Medicine

## 2018-05-09 NOTE — Telephone Encounter (Signed)
Left VM to call tomorrow for lab results.

## 2018-05-10 NOTE — Telephone Encounter (Signed)
Left message on VM with lab results and treated on day of visit with script.

## 2018-05-10 NOTE — Telephone Encounter (Signed)
Patient never had rx for flagyl filled; will call to [place].

## 2019-02-19 ENCOUNTER — Other Ambulatory Visit (HOSPITAL_COMMUNITY)
Admission: RE | Admit: 2019-02-19 | Discharge: 2019-02-19 | Disposition: A | Discharge status: Home / Self Care | Payer: Managed Care, Other (non HMO) | Source: Ambulatory Visit | Attending: Obstetrics and Gynecology | Admitting: Obstetrics and Gynecology

## 2019-02-19 ENCOUNTER — Other Ambulatory Visit: Payer: Self-pay | Admitting: Obstetrics and Gynecology

## 2019-02-19 DIAGNOSIS — R87810 Cervical high risk human papillomavirus (HPV) DNA test positive: Secondary | ICD-10-CM | POA: Diagnosis present

## 2019-02-26 LAB — CYTOLOGY - PAP
Comment: NEGATIVE
Comment: NEGATIVE
Comment: NEGATIVE
Diagnosis: NEGATIVE
HPV 16: NEGATIVE
HPV 18 / 45: NEGATIVE
High risk HPV: POSITIVE — AB

## 2019-05-26 ENCOUNTER — Other Ambulatory Visit: Payer: Self-pay | Admitting: Obstetrics and Gynecology

## 2020-06-07 DIAGNOSIS — E559 Vitamin D deficiency, unspecified: Secondary | ICD-10-CM | POA: Diagnosis not present

## 2020-06-07 DIAGNOSIS — Z13220 Encounter for screening for lipoid disorders: Secondary | ICD-10-CM | POA: Diagnosis not present

## 2020-06-07 DIAGNOSIS — Z0000 Encounter for general adult medical examination without abnormal findings: Secondary | ICD-10-CM | POA: Diagnosis not present

## 2020-06-20 DIAGNOSIS — Z23 Encounter for immunization: Secondary | ICD-10-CM | POA: Diagnosis not present

## 2020-08-28 DIAGNOSIS — Z23 Encounter for immunization: Secondary | ICD-10-CM | POA: Diagnosis not present

## 2020-11-28 DIAGNOSIS — F4323 Adjustment disorder with mixed anxiety and depressed mood: Secondary | ICD-10-CM | POA: Diagnosis not present

## 2020-12-01 DIAGNOSIS — N898 Other specified noninflammatory disorders of vagina: Secondary | ICD-10-CM | POA: Diagnosis not present

## 2020-12-01 DIAGNOSIS — B372 Candidiasis of skin and nail: Secondary | ICD-10-CM | POA: Diagnosis not present

## 2020-12-01 DIAGNOSIS — B373 Candidiasis of vulva and vagina: Secondary | ICD-10-CM | POA: Diagnosis not present

## 2020-12-20 DIAGNOSIS — F4323 Adjustment disorder with mixed anxiety and depressed mood: Secondary | ICD-10-CM | POA: Diagnosis not present

## 2020-12-25 DIAGNOSIS — Z1231 Encounter for screening mammogram for malignant neoplasm of breast: Secondary | ICD-10-CM | POA: Diagnosis not present

## 2021-01-04 DIAGNOSIS — F4323 Adjustment disorder with mixed anxiety and depressed mood: Secondary | ICD-10-CM | POA: Diagnosis not present

## 2021-03-12 DIAGNOSIS — D219 Benign neoplasm of connective and other soft tissue, unspecified: Secondary | ICD-10-CM | POA: Diagnosis not present

## 2021-03-12 DIAGNOSIS — Z01419 Encounter for gynecological examination (general) (routine) without abnormal findings: Secondary | ICD-10-CM | POA: Diagnosis not present

## 2021-03-12 DIAGNOSIS — Z124 Encounter for screening for malignant neoplasm of cervix: Secondary | ICD-10-CM | POA: Diagnosis not present

## 2021-04-12 DIAGNOSIS — R87810 Cervical high risk human papillomavirus (HPV) DNA test positive: Secondary | ICD-10-CM | POA: Diagnosis not present

## 2021-04-12 DIAGNOSIS — Z01411 Encounter for gynecological examination (general) (routine) with abnormal findings: Secondary | ICD-10-CM | POA: Diagnosis not present

## 2021-04-12 DIAGNOSIS — B977 Papillomavirus as the cause of diseases classified elsewhere: Secondary | ICD-10-CM | POA: Diagnosis not present

## 2021-06-01 DIAGNOSIS — F4323 Adjustment disorder with mixed anxiety and depressed mood: Secondary | ICD-10-CM | POA: Diagnosis not present

## 2021-07-20 DIAGNOSIS — N644 Mastodynia: Secondary | ICD-10-CM | POA: Diagnosis not present

## 2021-07-20 DIAGNOSIS — Z853 Personal history of malignant neoplasm of breast: Secondary | ICD-10-CM | POA: Diagnosis not present

## 2021-08-23 DIAGNOSIS — Z0000 Encounter for general adult medical examination without abnormal findings: Secondary | ICD-10-CM | POA: Diagnosis not present

## 2021-08-23 DIAGNOSIS — R030 Elevated blood-pressure reading, without diagnosis of hypertension: Secondary | ICD-10-CM | POA: Diagnosis not present

## 2021-08-23 DIAGNOSIS — Z13220 Encounter for screening for lipoid disorders: Secondary | ICD-10-CM | POA: Diagnosis not present

## 2021-09-26 DIAGNOSIS — Z9221 Personal history of antineoplastic chemotherapy: Secondary | ICD-10-CM | POA: Diagnosis not present

## 2021-09-26 DIAGNOSIS — Z9011 Acquired absence of right breast and nipple: Secondary | ICD-10-CM | POA: Diagnosis not present

## 2021-09-26 DIAGNOSIS — R922 Inconclusive mammogram: Secondary | ICD-10-CM | POA: Diagnosis not present

## 2021-09-26 DIAGNOSIS — Z853 Personal history of malignant neoplasm of breast: Secondary | ICD-10-CM | POA: Diagnosis not present

## 2021-09-26 DIAGNOSIS — N644 Mastodynia: Secondary | ICD-10-CM | POA: Diagnosis not present

## 2021-12-18 DIAGNOSIS — J209 Acute bronchitis, unspecified: Secondary | ICD-10-CM | POA: Diagnosis not present

## 2022-02-18 DIAGNOSIS — F4323 Adjustment disorder with mixed anxiety and depressed mood: Secondary | ICD-10-CM | POA: Diagnosis not present

## 2022-03-14 DIAGNOSIS — Z01419 Encounter for gynecological examination (general) (routine) without abnormal findings: Secondary | ICD-10-CM | POA: Diagnosis not present

## 2022-03-14 DIAGNOSIS — Z124 Encounter for screening for malignant neoplasm of cervix: Secondary | ICD-10-CM | POA: Diagnosis not present

## 2022-04-09 ENCOUNTER — Other Ambulatory Visit: Payer: Self-pay

## 2022-04-09 ENCOUNTER — Encounter (HOSPITAL_BASED_OUTPATIENT_CLINIC_OR_DEPARTMENT_OTHER): Payer: Self-pay | Admitting: Emergency Medicine

## 2022-04-09 ENCOUNTER — Emergency Department (HOSPITAL_BASED_OUTPATIENT_CLINIC_OR_DEPARTMENT_OTHER)
Admission: EM | Admit: 2022-04-09 | Discharge: 2022-04-09 | Disposition: A | Discharge status: Home / Self Care | Payer: BC Managed Care – PPO | Attending: Emergency Medicine | Admitting: Emergency Medicine

## 2022-04-09 ENCOUNTER — Emergency Department (HOSPITAL_BASED_OUTPATIENT_CLINIC_OR_DEPARTMENT_OTHER): Payer: BC Managed Care – PPO

## 2022-04-09 DIAGNOSIS — R001 Bradycardia, unspecified: Secondary | ICD-10-CM | POA: Diagnosis not present

## 2022-04-09 DIAGNOSIS — J101 Influenza due to other identified influenza virus with other respiratory manifestations: Secondary | ICD-10-CM | POA: Insufficient documentation

## 2022-04-09 DIAGNOSIS — Z853 Personal history of malignant neoplasm of breast: Secondary | ICD-10-CM | POA: Diagnosis not present

## 2022-04-09 DIAGNOSIS — R0602 Shortness of breath: Secondary | ICD-10-CM | POA: Diagnosis not present

## 2022-04-09 DIAGNOSIS — Z79899 Other long term (current) drug therapy: Secondary | ICD-10-CM | POA: Diagnosis not present

## 2022-04-09 DIAGNOSIS — J9801 Acute bronchospasm: Secondary | ICD-10-CM | POA: Diagnosis not present

## 2022-04-09 DIAGNOSIS — Z0130 Encounter for examination of blood pressure without abnormal findings: Secondary | ICD-10-CM | POA: Diagnosis not present

## 2022-04-09 DIAGNOSIS — R059 Cough, unspecified: Secondary | ICD-10-CM | POA: Diagnosis not present

## 2022-04-09 DIAGNOSIS — J111 Influenza due to unidentified influenza virus with other respiratory manifestations: Secondary | ICD-10-CM | POA: Diagnosis not present

## 2022-04-09 DIAGNOSIS — I10 Essential (primary) hypertension: Secondary | ICD-10-CM | POA: Diagnosis not present

## 2022-04-09 DIAGNOSIS — G4489 Other headache syndrome: Secondary | ICD-10-CM | POA: Diagnosis not present

## 2022-04-09 DIAGNOSIS — I517 Cardiomegaly: Secondary | ICD-10-CM | POA: Diagnosis not present

## 2022-04-09 DIAGNOSIS — Z6827 Body mass index (BMI) 27.0-27.9, adult: Secondary | ICD-10-CM | POA: Diagnosis not present

## 2022-04-09 DIAGNOSIS — R509 Fever, unspecified: Secondary | ICD-10-CM | POA: Diagnosis not present

## 2022-04-09 NOTE — ED Triage Notes (Signed)
BIB [facility] from [place]. SOB-FLU A+-tested today.  Given duo neb-hand numbness during treatment. Sensation returned after treatment. Aox4. Ambulatory on arrival to ED.  EMS: 152/90 64HR 99% RA 98.70F oral

## 2022-04-09 NOTE — ED Notes (Signed)
MD at the Bedside. 

## 2022-04-09 NOTE — ED Provider Notes (Signed)
[place] EMERGENCY DEPT Provider Note   CSN: [id number] Arrival date & time: 04/09/22  1112     History Patient with a history of hypertension not on any chronic meds sent here by her PCP after she developed numbness in her fingers with a duoneb therapy.  History is also significant for breast cancer status post lumpectomy in 2014. Chief Complaint  Patient presents with   Shortness of Breath    Audrey Friedman is a 61 y.o. female.  Patient has had symptoms since Friday.  She has had fevers, chills, body aches, and some shortness of breath.  She felt like she was generally getting better until last night when she developed a cough that caused acute worsening in her shortness of breath.  She does not have any known history of pulmonary disease.  Does not take any medications at baseline.  She missed MD appointment with her primary care doctor at [facility] this morning.  There she was diagnosed with Influenza A and was given a DuoNeb treatment for her cough/shortness of breath.  During administration of the DuoNebs she developed numbness and tingling in her fingertips and EMS was called for transport here.  She rapidly regained sensation in her fingers after completion of the DuoNeb.   Shortness of Breath Associated symptoms: cough        Home Medications Prior to Admission medications   Medication Sig Start Date End Date Taking? Authorizing Provider  Biotin 5000 MCG TABS Take by mouth.    [provider]  Cholecalciferol (VITAMIN D PO) Take by mouth once a week.    [provider]  ibuprofen (ADVIL,MOTRIN) 200 MG tablet Take 800 mg by mouth every 6 (six) hours as needed for pain.     [provider]  metroNIDAZOLE (FLAGYL) 500 MG tablet Take 1 tablet (500 mg total) by mouth 2 (two) times daily. 05/06/18   Noe Gens, PA-C  Multiple Vitamins-Minerals (MULTI VITAMIN/MINERALS PO) Take by mouth.    [provider]       Allergies    Patient has no known allergies.    Review of Systems   Review of Systems  Respiratory:  Positive for cough and shortness of breath.     Physical Exam Updated Vital Signs BP (!) 176/85   Pulse 62   Temp 98.8 F (37.1 C) (Oral)   Resp (!) 22   Ht '5\' 9"'$  (1.753 m)   Wt 86.2 kg   SpO2 96%   BMI 28.06 kg/m  Physical Exam Vitals reviewed.  Constitutional:      General: She is not in acute distress.    Appearance: She is not ill-appearing or toxic-appearing.  Cardiovascular:     Rate and Rhythm: Normal rate and regular rhythm.  Pulmonary:     Effort: Pulmonary effort is normal.     Comments: Coarse breath sounds throughout without foci of crackles or wheeze. Normal work of breathing on room air. Chest:     Chest wall: No deformity or tenderness.  Abdominal:     Palpations: Abdomen is soft.  Musculoskeletal:     Right lower leg: No edema.     Left lower leg: No edema.     Comments: Normal sensation and strength in fingers.  Lymphadenopathy:     Cervical: No cervical adenopathy.  Skin:    General: Skin is warm and dry.  Neurological:     General: No focal deficit present.     ED Results / Procedures /  Treatments   Labs (all labs ordered are listed, but only abnormal results are displayed) Labs Reviewed - No data to display  EKG EKG Interpretation  Date/Time:  Tuesday April 09 2022 11:20:19 EST Ventricular Rate:  61 PR Interval:  207 QRS Duration: 87 QT Interval:  426 QTC Calculation: 430 R Axis:   -13 Text Interpretation: Sinus rhythm Borderline prolonged PR interval Probable left atrial enlargement Borderline T wave abnormalities No previous ECGs available Confirmed by Fredia Sorrow [phone]) on 04/09/2022 11:25:03 AM  Radiology DG Chest Portable 1 View  Result Date: 04/09/2022 CLINICAL DATA:  Provided history: Shortness of breath, flu positive. EXAM: PORTABLE CHEST 1 VIEW COMPARISON:  Prior chest radiographs 12/14/2003. Chest CT  06/13/2005. FINDINGS: Heart size within normal limits. No appreciable airspace consolidation. No evidence of pleural effusion or pneumothorax. No acute bony abnormality identified. IMPRESSION: No evidence of acute cardiopulmonary abnormality. Electronically Signed   By: Kellie Simmering D.O.   On: 04/09/2022 12:04    Procedures Procedures    Medications Ordered in ED Medications - No data to display  ED Course/ Medical Decision Making/ A&P                           Medical Decision Making Patient here due to numbness and tingling in her fingers during a DuoNeb therapy at her PCP office.  Sensation was transient and resolved with discontinuation of the nebulizer.  No acute shortness of breath, rash, other symptoms to suggest allergic/anaphylactic reaction.  Neurovascularly intact with a normal exam at this time. Known diagnosis of influenza A from her PCP office suspect this is the source of the systemic symptoms that she has had since last Friday.  Given that she is 5 days into symptoms, no indication for Tamiflu at this time.  Suspect that symptoms will be self resolving with time.  Satting 100% on room air with a normal work of breathing in the room.  Supportive care measures reviewed.  Stable for discharge home.  Amount and/or Complexity of Data Reviewed Radiology: ordered.    Final Clinical Impression(s) / ED Diagnoses Final diagnoses:  Influenza A    Rx / DC Orders ED Discharge Orders     None      [name], MD    Eppie Gibson, MD 04/09/22 1255    Fredia Sorrow, MD 04/09/22 1257

## 2022-04-09 NOTE — ED Notes (Signed)
DC papers reviewed. No questions or concerns. No signs of distress. Pt assisted to wheelchair and out to lobby. Appropriate measures for safety taken. 

## 2022-04-09 NOTE — ED Notes (Addendum)
Ambulates to restroom with minimal assistance. Steady on feet.

## 2022-04-09 NOTE — Discharge Instructions (Signed)
Audrey Friedman,  I'm sorry you've got the flu. I expect this will run its course in the next few days and should be getting better.  I suspect that the tingling that you had in your fingers was just a reaction to the albuterol that you received at your doctor's office and is not anything that we need to be concerned with at this time.  If you start to develop shortness of breath or worsening chest pain/tightness, do not hesitate to go back to your doctor or come back to see [place] here.  [name], MD

## 2022-09-16 DIAGNOSIS — Z0000 Encounter for general adult medical examination without abnormal findings: Secondary | ICD-10-CM | POA: Diagnosis not present

## 2022-09-16 DIAGNOSIS — Z13220 Encounter for screening for lipoid disorders: Secondary | ICD-10-CM | POA: Diagnosis not present

## 2022-09-16 DIAGNOSIS — Z23 Encounter for immunization: Secondary | ICD-10-CM | POA: Diagnosis not present

## 2022-09-26 DIAGNOSIS — J40 Bronchitis, not specified as acute or chronic: Secondary | ICD-10-CM | POA: Diagnosis not present

## 2022-09-26 DIAGNOSIS — J069 Acute upper respiratory infection, unspecified: Secondary | ICD-10-CM | POA: Diagnosis not present

## 2022-10-07 DIAGNOSIS — Z1231 Encounter for screening mammogram for malignant neoplasm of breast: Secondary | ICD-10-CM | POA: Diagnosis not present

## 2022-10-07 DIAGNOSIS — R92323 Mammographic fibroglandular density, bilateral breasts: Secondary | ICD-10-CM | POA: Diagnosis not present

## 2022-12-11 ENCOUNTER — Ambulatory Visit (INDEPENDENT_AMBULATORY_CARE_PROVIDER_SITE_OTHER): Payer: BC Managed Care – PPO | Admitting: Podiatry

## 2022-12-11 DIAGNOSIS — B351 Tinea unguium: Secondary | ICD-10-CM

## 2022-12-11 DIAGNOSIS — D2371 Other benign neoplasm of skin of right lower limb, including hip: Secondary | ICD-10-CM | POA: Diagnosis not present

## 2022-12-11 MED ORDER — TERBINAFINE HCL 250 MG PO TABS: 250.0000 mg | ORAL_TABLET | Freq: Every day | ORAL | 0 refills | Status: DC

## 2022-12-11 NOTE — Progress Notes (Signed)
No chief complaint on file.   Subjective: 62 y.o. female presenting to the office today for evaluation of symptomatic skin lesion overlying the right second digit.  Onset less than 1 year.  She has tried topical salicylic acid with no improvement.  It is becoming symptomatic in close toed shoes  Patient also states that for several years she has had discoloration with thickening to the toenails bilaterally.  She generally applies acrylic nails to cover them.  She has not anything for treatment   Past Medical History:  Diagnosis Date   Breast cancer [facility]) 2011   right breast    Past Surgical History:  Procedure Laterality Date   BIOPSY BREAST  2011 and 1990's   BREAST LUMPECTOMY Right    nov. 2011 radiation and chemo   PORT-A-CATH REMOVAL Left 12/31/2012   Procedure: REMOVAL PORT-A-CATH;  Surgeon: Almond Lint, MD;  Location: [facility];  Service: General;  Laterality: Left;   PORTACATH PLACEMENT  05/2010   right breast lumpectomy     2011   TUBAL LIGATION  1991    No Known Allergies   12/11/2022  Objective:  Physical Exam General: Alert and oriented x3 in no acute distress  Dermatology: Hyperkeratotic lesion(s) present on the right second toe. Pain on palpation with a central nucleated core noted. Skin is warm, dry and supple bilateral lower extremities. Negative for open lesions or macerations. Hyperkeratotic dystrophic nails noted 1-5 bilateral  Vascular: Palpable pedal pulses bilaterally. No edema or erythema noted. Capillary refill within normal limits.  Neurological: Grossly intact via light touch  Musculoskeletal Exam: Pain on palpation at the keratotic lesion(s) noted. Range of motion within normal limits bilateral. Muscle strength 5/5 in all groups bilateral.  Results Component Value Reference Range Notes  Lipid Panel w/reflex Reviewed date:09/16/2022 02:12:31 PM Interpretation:LDL 158, Chol 264 Performing Lab: Notes/Report: Testing  Performed at: [facility], [address], [place], [place] [address]  Cholesterol 264 <200 mg/dL    CHOL/HDL 2.6 1.9-1.4 Ratio    HDLD 101 30-85 mg/dL Values below 40 mg/dL indicate increased risk factor  Triglyceride 42 0-199 mg/dL    NHDL 782 9-562 mg/dL Range dependent upon risk factors.  LDL Chol Calc (NIH) 158 0-99 mg/dL    Comp Metabolic Panel Reviewed date:09/16/2022 02:11:27 PM Interpretation:Normal Performing Lab: Notes/Report: Testing Performed at: [facility], [address], [address], [place], [place] [address]  Glucose 82 70-99 mg/dL    BUN 13 5-78 mg/dL    Creatinine 4.69 6.29-5.28 mg/dl    [id number] 86 >02 calc In accordance with recommendations from NKF-ASN Task Force, [facility] has updated its eGFR calc to the 2021 CKD-EDI equation that estimates kidney function without a race variable;Stage 1 > 90 ML/Min plus Albuminuria;Stage 2 60-89 ML/MIN;Stage 3 30-59 ML/MIN;Stage 4 15-29 ML/MIN;Stage 5 <15 ML/MIN  Sodium 140 136-145 mmol/L    Potassium 4.8 3.5-5.5 mmol/L    Chloride 104 98-107 mmol/L    CO2 30 22-32 mmol/L    Anion Gap 11.8 6.0-20.0 mmol/L    Calcium 10.2 8.6-10.3 mg/dL    CA-corrected 7.25 3.66-44.03 mg/dL    Protein, Total 7.7 4.7-4.2 g/dL    Albumin 4.6 5.9-5.6 g/dL    TBIL 0.7 3.8-7.5 mg/dL    ALP 643 32-951 U/L    AST 33 0-39 U/L    ALT 28 0-52 U/L    CBC without Diff Reviewed date:09/16/2022 02:02:51 PM Interpretation:WBC 3.4 Performing Lab: Notes/Report: Testing Performed at: [facility], [address],  [address], [place], [place] [address]  WBC 3.4 4.0-11.0 K/ul    RBC 4.86 4.20-5.40 M/uL    HGB 13.8 12.0-16.0 g/dL    HCT 65.7 84.6-96.2 %    MCV 87.9 81.0-99.0 fL    MCH 28.5 27.0-33.0 pg    MCHC 32.4 32.0-36.0 g/dL    RDW 95.2 84.1-32.4 %    PLT 257 150-400 K/uL      Assessment: 1.  Symptomatic benign skin lesion 2.  Fungal nail infection bilateral   Plan of Care:  -Patient evaluated -Excisional debridement of keratoic  lesion(s) using a chisel blade was performed without incident.  -Salicylic acid applied with a bandaid -Today we discussed different treatment options for toenail fungus including oral, topical, and laser antifungal treatment modalities.  Relative efficacies and risks and benefits of each modality were explained and discussed in detail.  Patient opts for oral antifungal medication -Prescription for Lamisil 2 and 50 mg #90 daily.  CMP on 09/16/2022 hepatic function WNL -Return to clinic 6 months  Felecia Shelling, DPM [facility]  Dr. Felecia Shelling, DPM    [address] [place], [place] [address]                Office [phone]  Fax [phone]

## 2023-01-20 DIAGNOSIS — F4323 Adjustment disorder with mixed anxiety and depressed mood: Secondary | ICD-10-CM | POA: Diagnosis not present

## 2023-01-28 ENCOUNTER — Ambulatory Visit: Payer: BC Managed Care – PPO | Admitting: Podiatry

## 2023-03-31 DIAGNOSIS — F4323 Adjustment disorder with mixed anxiety and depressed mood: Secondary | ICD-10-CM | POA: Diagnosis not present

## 2023-04-07 DIAGNOSIS — Z01419 Encounter for gynecological examination (general) (routine) without abnormal findings: Secondary | ICD-10-CM | POA: Diagnosis not present

## 2023-04-07 DIAGNOSIS — Z124 Encounter for screening for malignant neoplasm of cervix: Secondary | ICD-10-CM | POA: Diagnosis not present

## 2023-04-10 ENCOUNTER — Other Ambulatory Visit: Payer: Self-pay | Admitting: Obstetrics and Gynecology

## 2023-04-10 DIAGNOSIS — Z853 Personal history of malignant neoplasm of breast: Secondary | ICD-10-CM

## 2023-04-10 DIAGNOSIS — N644 Mastodynia: Secondary | ICD-10-CM

## 2023-05-19 DIAGNOSIS — R92323 Mammographic fibroglandular density, bilateral breasts: Secondary | ICD-10-CM | POA: Diagnosis not present

## 2023-05-19 DIAGNOSIS — N644 Mastodynia: Secondary | ICD-10-CM | POA: Diagnosis not present

## 2023-05-19 DIAGNOSIS — Z853 Personal history of malignant neoplasm of breast: Secondary | ICD-10-CM | POA: Diagnosis not present

## 2023-05-19 DIAGNOSIS — Z923 Personal history of irradiation: Secondary | ICD-10-CM | POA: Diagnosis not present

## 2023-06-16 ENCOUNTER — Ambulatory Visit: Payer: BC Managed Care – PPO | Admitting: Podiatry

## 2023-09-23 DIAGNOSIS — Z0000 Encounter for general adult medical examination without abnormal findings: Secondary | ICD-10-CM | POA: Diagnosis not present

## 2023-09-23 DIAGNOSIS — R030 Elevated blood-pressure reading, without diagnosis of hypertension: Secondary | ICD-10-CM | POA: Diagnosis not present

## 2023-09-23 DIAGNOSIS — M25512 Pain in left shoulder: Secondary | ICD-10-CM | POA: Diagnosis not present

## 2023-09-23 DIAGNOSIS — Z13220 Encounter for screening for lipoid disorders: Secondary | ICD-10-CM | POA: Diagnosis not present

## 2023-10-22 DIAGNOSIS — M25512 Pain in left shoulder: Secondary | ICD-10-CM | POA: Diagnosis not present

## 2023-11-20 DIAGNOSIS — D219 Benign neoplasm of connective and other soft tissue, unspecified: Secondary | ICD-10-CM | POA: Diagnosis not present

## 2023-11-20 DIAGNOSIS — N950 Postmenopausal bleeding: Secondary | ICD-10-CM | POA: Diagnosis not present

## 2023-11-21 ENCOUNTER — Encounter: Payer: Self-pay | Admitting: Advanced Practice Midwife

## 2024-04-15 DIAGNOSIS — N644 Mastodynia: Secondary | ICD-10-CM | POA: Diagnosis not present

## 2024-04-15 DIAGNOSIS — Z01419 Encounter for gynecological examination (general) (routine) without abnormal findings: Secondary | ICD-10-CM | POA: Diagnosis not present

## 2024-04-15 DIAGNOSIS — Z1331 Encounter for screening for depression: Secondary | ICD-10-CM | POA: Diagnosis not present

## 2024-04-15 DIAGNOSIS — Z853 Personal history of malignant neoplasm of breast: Secondary | ICD-10-CM | POA: Diagnosis not present

## 2024-04-15 DIAGNOSIS — Z124 Encounter for screening for malignant neoplasm of cervix: Secondary | ICD-10-CM | POA: Diagnosis not present

## 2024-04-15 DIAGNOSIS — N898 Other specified noninflammatory disorders of vagina: Secondary | ICD-10-CM | POA: Diagnosis not present

## 2024-04-15 DIAGNOSIS — N9089 Other specified noninflammatory disorders of vulva and perineum: Secondary | ICD-10-CM | POA: Diagnosis not present

## 2024-05-20 ENCOUNTER — Ambulatory Visit

## 2024-05-20 ENCOUNTER — Ambulatory Visit
Admission: RE | Admit: 2024-05-20 | Discharge: 2024-05-20 | Disposition: A | Discharge status: Home / Self Care | Attending: Family Medicine | Admitting: Family Medicine

## 2024-05-20 VITALS — BP 139/85 | HR 64 | Temp 98.1°F | Resp 20

## 2024-05-20 DIAGNOSIS — M25562 Pain in left knee: Secondary | ICD-10-CM

## 2024-05-20 DIAGNOSIS — M170 Bilateral primary osteoarthritis of knee: Secondary | ICD-10-CM

## 2024-05-20 DIAGNOSIS — M25561 Pain in right knee: Secondary | ICD-10-CM

## 2024-05-20 DIAGNOSIS — G8929 Other chronic pain: Secondary | ICD-10-CM

## 2024-05-20 MED ORDER — MELOXICAM 15 MG PO TABS: 15.0000 mg | ORAL_TABLET | Freq: Every day | ORAL | 0 refills | Status: AC

## 2024-05-20 NOTE — ED Provider Notes (Signed)
 " [facility]    CSN: [id number] Arrival date & time: 05/20/24  1556      History   Chief Complaint Chief Complaint  Patient presents with   Leg Pain    constant and progressive leg pain , mores so in right leg  painful to walk.  Left knee pain occasionally - Entered by patient    HPI Audrey Friedman is a 64 y.o. female.   HPI  Patient describes progressive worsening leg pain right and left.  On her left leg is more in the knee.  The right leg is from the knee down into her shin.  Worse with weightbearing.  Hurts even when she sleeps at night.  She does have some grinding noise with movement of her knee.  No swelling.  No buckling.  No instability.  No injury. Remote history of breast cancer 2011 Past Medical History:  Diagnosis Date   Breast cancer [facility]) 2011   right breast    Patient Active Problem List   Diagnosis Date Noted   Breast cancer, right breast [facility])     Past Surgical History:  Procedure Laterality Date   BIOPSY BREAST  2011 and 1990's   BREAST LUMPECTOMY Right    nov. 2011 radiation and chemo   PORT-A-CATH REMOVAL Left 12/31/2012   Procedure: REMOVAL PORT-A-CATH;  Surgeon: Jina Nephew, MD;  Location: [facility];  Service: General;  Laterality: Left;   PORTACATH PLACEMENT  05/2010   right breast lumpectomy     2011   TUBAL LIGATION  1991    OB History   No obstetric history on file.      Home Medications    Prior to Admission medications  Medication Sig Start Date End Date Taking? Authorizing Provider  meloxicam  (MOBIC ) 15 MG tablet Take 1 tablet (15 mg total) by mouth daily. 05/20/24  Yes Maranda Jamee Jacob, MD  Biotin 5000 MCG TABS Take by mouth.    [provider]  Cholecalciferol (VITAMIN D PO) Take by mouth once a week.    [provider]    Family History Family History  Problem Relation Age of Onset   Throat cancer Mother        smoker   Cancer Mother        throat   Cancer  Maternal Grandmother        unknown cancer   HIV/AIDS Brother    Aneurysm Brother     Social History Social History[1]   Allergies   Patient has no known allergies.   Review of Systems Review of Systems See HPI  Physical Exam Triage Vital Signs ED Triage Vitals [05/20/24 1608]  Encounter Vitals Group     BP (!) 143/87     Girls Systolic BP Percentile      Girls Diastolic BP Percentile      Boys Systolic BP Percentile      Boys Diastolic BP Percentile      Pulse Rate 64     Resp 20     Temp 98.1 F (36.7 C)     Temp Source Oral     SpO2 100 %     Weight      Height      Head Circumference      Peak Flow      Pain Score 8     Pain Loc      Pain Education      Exclude from Growth Chart    No  data found.  Updated Vital Signs BP 139/85   Pulse 64   Temp 98.1 F (36.7 C) (Oral)   Resp 20   SpO2 100%       Physical Exam Constitutional:      General: She is not in acute distress.    Appearance: She is well-developed and normal weight.  HENT:     Head: Normocephalic and atraumatic.  Eyes:     Conjunctiva/sclera: Conjunctivae normal.     Pupils: Pupils are equal, round, and reactive to light.  Cardiovascular:     Rate and Rhythm: Normal rate.  Pulmonary:     Effort: Pulmonary effort is normal. No respiratory distress.  Musculoskeletal:        General: Normal range of motion.     Cervical back: Normal range of motion.     Comments: There is no effusion in either knee.  Mild warmth.  There is patellofemoral crepitus bilaterally.  There is no instability.  No joint line tenderness.  Skin:    General: Skin is warm and dry.  Neurological:     Mental Status: She is alert.      UC Treatments / Results  Labs (all labs ordered are listed, but only abnormal results are displayed) Labs Reviewed - No data to display  EKG   Radiology DG Knee Complete 4 Views Left Result Date: 05/20/2024 CLINICAL DATA:  Bilateral knee pain for 6 months EXAM: LEFT KNEE  - COMPLETE 4+ VIEW; RIGHT KNEE - COMPLETE 4+ VIEW COMPARISON:  None Available. FINDINGS: Right knee: Frontal, bilateral oblique, lateral views of the right knee are obtained on 4 images. No fracture, subluxation, or dislocation. Mild medial and lateral compartmental osteophytes crash that mild 3 compartmental osteoarthritis. No joint effusion. Soft tissues are unremarkable. Left knee: Frontal, bilateral oblique, lateral views of the left knee are obtained. No acute fracture, subluxation, or dislocation. Mild joint space narrowing and osteophyte formation within the medial compartment. There appears to be an osteochondral defect within the upper pole patella, measuring approximately 0.7 cm. No joint effusion. Soft tissues are unremarkable. IMPRESSION: 1. Right knee mild 3 compartmental osteoarthritis. 2. Left knee mild medial compartmental osteoarthritis, with evidence of focal osteochondral defect within the upper pole of the patella. 3. No acute displaced fracture. Electronically Signed   By: Ozell Daring M.D.   On: 05/20/2024 16:50   DG Knee Complete 4 Views Right Result Date: 05/20/2024 CLINICAL DATA:  Bilateral knee pain for 6 months EXAM: LEFT KNEE - COMPLETE 4+ VIEW; RIGHT KNEE - COMPLETE 4+ VIEW COMPARISON:  None Available. FINDINGS: Right knee: Frontal, bilateral oblique, lateral views of the right knee are obtained on 4 images. No fracture, subluxation, or dislocation. Mild medial and lateral compartmental osteophytes crash that mild 3 compartmental osteoarthritis. No joint effusion. Soft tissues are unremarkable. Left knee: Frontal, bilateral oblique, lateral views of the left knee are obtained. No acute fracture, subluxation, or dislocation. Mild joint space narrowing and osteophyte formation within the medial compartment. There appears to be an osteochondral defect within the upper pole patella, measuring approximately 0.7 cm. No joint effusion. Soft tissues are unremarkable. IMPRESSION: 1. Right  knee mild 3 compartmental osteoarthritis. 2. Left knee mild medial compartmental osteoarthritis, with evidence of focal osteochondral defect within the upper pole of the patella. 3. No acute displaced fracture. Electronically Signed   By: Ozell Daring M.D.   On: 05/20/2024 16:50    Procedures Procedures (including critical care time)  Medications Ordered in UC Medications - No  data to display  Initial Impression / Assessment and Plan / UC Course  I have reviewed the triage vital signs and the nursing notes.  Pertinent labs & imaging results that were available during my care of the patient were reviewed by me and considered in my medical decision making (see chart for details).     Likely pain from early arthritis.  Will try meloxicam .  Patient is told to see her PCP if she fails to improve Final Clinical Impressions(s) / UC Diagnoses   Final diagnoses:  Bilateral chronic knee pain  Bilateral primary osteoarthritis of knee     Discharge Instructions      Take meloxicam  once a day with food Call your doctor if you need meloxicam  refills See your doctor if your pain persists   ED Prescriptions     Medication Sig Dispense Auth. Provider   meloxicam  (MOBIC ) 15 MG tablet Take 1 tablet (15 mg total) by mouth daily. 30 tablet Maranda Jamee Jacob, MD      PDMP not reviewed this encounter.    [1]  Social History Tobacco Use   Smoking status: Never   Smokeless tobacco: Never  Substance Use Topics   Alcohol use: No   Drug use: No     Maranda Jamee Jacob, MD 05/20/24 1737  "

## 2024-05-20 NOTE — Discharge Instructions (Signed)
 Take meloxicam  once a day with food Call your doctor if you need meloxicam  refills See your doctor if your pain persists

## 2024-05-20 NOTE — ED Triage Notes (Signed)
 Pt reports that she has been having bilateral leg/knee pain for months. Reports started out with left knee and now both knees. Reports pain makes hard to walk. Thought it was due to wearing heels.
